# Patient Record
Sex: Female | Born: 1950 | Race: White | Hispanic: No | Marital: Married | Smoking: Never smoker
Health system: Southern US, Community
[De-identification: ages and names within clinical notes are randomized; demographics above are authoritative.]

## PROBLEM LIST (undated history)

## (undated) DIAGNOSIS — I10 Essential (primary) hypertension: Secondary | ICD-10-CM

## (undated) DIAGNOSIS — I639 Cerebral infarction, unspecified: Secondary | ICD-10-CM

## (undated) DIAGNOSIS — C801 Malignant (primary) neoplasm, unspecified: Secondary | ICD-10-CM

## (undated) DIAGNOSIS — E079 Disorder of thyroid, unspecified: Secondary | ICD-10-CM

## (undated) HISTORY — PX: THYROIDECTOMY: SHX17

## (undated) HISTORY — PX: BACK SURGERY: SHX140

## (undated) HISTORY — PX: MASTECTOMY: SHX3

## (undated) HISTORY — PX: ABDOMINAL HYSTERECTOMY: SHX81

## (undated) HISTORY — PX: JOINT REPLACEMENT: SHX530

---

## 2014-05-23 ENCOUNTER — Emergency Department (HOSPITAL_BASED_OUTPATIENT_CLINIC_OR_DEPARTMENT_OTHER)
Admission: EM | Admit: 2014-05-23 | Discharge: 2014-05-23 | Disposition: A | Discharge status: Home / Self Care | Payer: BC Managed Care – PPO | Attending: Emergency Medicine | Admitting: Emergency Medicine

## 2014-05-23 ENCOUNTER — Encounter (HOSPITAL_BASED_OUTPATIENT_CLINIC_OR_DEPARTMENT_OTHER): Payer: Self-pay | Admitting: Emergency Medicine

## 2014-05-23 ENCOUNTER — Emergency Department (HOSPITAL_BASED_OUTPATIENT_CLINIC_OR_DEPARTMENT_OTHER): Payer: BC Managed Care – PPO

## 2014-05-23 DIAGNOSIS — J45909 Unspecified asthma, uncomplicated: Secondary | ICD-10-CM

## 2014-05-23 DIAGNOSIS — Z853 Personal history of malignant neoplasm of breast: Secondary | ICD-10-CM | POA: Insufficient documentation

## 2014-05-23 DIAGNOSIS — M7989 Other specified soft tissue disorders: Secondary | ICD-10-CM | POA: Insufficient documentation

## 2014-05-23 DIAGNOSIS — J45901 Unspecified asthma with (acute) exacerbation: Secondary | ICD-10-CM | POA: Insufficient documentation

## 2014-05-23 DIAGNOSIS — Z8639 Personal history of other endocrine, nutritional and metabolic disease: Secondary | ICD-10-CM | POA: Insufficient documentation

## 2014-05-23 DIAGNOSIS — Z862 Personal history of diseases of the blood and blood-forming organs and certain disorders involving the immune mechanism: Secondary | ICD-10-CM | POA: Insufficient documentation

## 2014-05-23 DIAGNOSIS — IMO0002 Reserved for concepts with insufficient information to code with codable children: Secondary | ICD-10-CM | POA: Insufficient documentation

## 2014-05-23 DIAGNOSIS — I10 Essential (primary) hypertension: Secondary | ICD-10-CM | POA: Insufficient documentation

## 2014-05-23 DIAGNOSIS — Z79899 Other long term (current) drug therapy: Secondary | ICD-10-CM | POA: Insufficient documentation

## 2014-05-23 HISTORY — DX: Essential (primary) hypertension: I10

## 2014-05-23 HISTORY — DX: Malignant (primary) neoplasm, unspecified: C80.1

## 2014-05-23 HISTORY — DX: Disorder of thyroid, unspecified: E07.9

## 2014-05-23 LAB — BASIC METABOLIC PANEL
Anion gap: 12 (ref 5–15)
BUN: 12 mg/dL (ref 6–23)
CALCIUM: 9.8 mg/dL (ref 8.4–10.5)
CO2: 32 mEq/L (ref 19–32)
CREATININE: 0.7 mg/dL (ref 0.50–1.10)
Chloride: 101 mEq/L (ref 96–112)
GFR calc Af Amer: >90 mL/min (ref >90)
GFR calc non Af Amer: >90 mL/min (ref >90)
GLUCOSE: 130 mg/dL — AB (ref 70–99)
Potassium: 3.2 mEq/L — ABNORMAL LOW (ref 3.7–5.3)
SODIUM: 145 meq/L (ref 137–147)

## 2014-05-23 LAB — CBC WITH DIFFERENTIAL/PLATELET
Basophils Absolute: 0 10*3/uL (ref 0.0–0.1)
Basophils Relative: 0 % (ref 0–1)
EOS ABS: 0.1 10*3/uL (ref 0.0–0.7)
EOS PCT: 1 % (ref 0–5)
HCT: 34.4 % — ABNORMAL LOW (ref 36.0–46.0)
HEMOGLOBIN: 10.8 g/dL — AB (ref 12.0–15.0)
Lymphocytes Relative: 19 % (ref 12–46)
Lymphs Abs: 0.9 10*3/uL (ref 0.7–4.0)
MCH: 32.2 pg (ref 26.0–34.0)
MCHC: 31.4 g/dL (ref 30.0–36.0)
MCV: 102.7 fL — ABNORMAL HIGH (ref 78.0–100.0)
MONO ABS: 0.7 10*3/uL (ref 0.1–1.0)
Monocytes Relative: 15 % — ABNORMAL HIGH (ref 3–12)
NEUTROS PCT: 64 % (ref 43–77)
Neutro Abs: 2.9 10*3/uL (ref 1.7–7.7)
Platelets: 207 10*3/uL (ref 150–400)
RBC: 3.35 MIL/uL — ABNORMAL LOW (ref 3.87–5.11)
RDW: 20.9 % — ABNORMAL HIGH (ref 11.5–15.5)
WBC: 4.5 10*3/uL (ref 4.0–10.5)

## 2014-05-23 LAB — TROPONIN I: Troponin I: <0.3 ng/mL (ref <0.30)

## 2014-05-23 LAB — PRO B NATRIURETIC PEPTIDE: Pro B Natriuretic peptide (BNP): 1066 pg/mL — ABNORMAL HIGH (ref 0–125)

## 2014-05-23 MED ORDER — IPRATROPIUM BROMIDE 0.02 % IN SOLN
0.5000 mg | Freq: Once | RESPIRATORY_TRACT | Status: AC
  Administered 2014-05-23: 0.5 mg via RESPIRATORY_TRACT
  Filled 2014-05-23: qty 2.5

## 2014-05-23 MED ORDER — ALBUTEROL SULFATE (2.5 MG/3ML) 0.083% IN NEBU
5.0000 mg | INHALATION_SOLUTION | Freq: Once | RESPIRATORY_TRACT | Status: AC
  Administered 2014-05-23: 5 mg via RESPIRATORY_TRACT
  Filled 2014-05-23: qty 6

## 2014-05-23 MED ORDER — PREDNISONE 50 MG PO TABS
60.0000 mg | ORAL_TABLET | Freq: Once | ORAL | Status: AC
  Administered 2014-05-23: 60 mg via ORAL
  Filled 2014-05-23 (×2): qty 1

## 2014-05-23 MED ORDER — PREDNISONE 20 MG PO TABS: 40.0000 mg | ORAL_TABLET | Freq: Every day | ORAL | Status: DC

## 2014-05-23 MED ORDER — BECLOMETHASONE DIPROPIONATE 80 MCG/ACT IN AERS: 2.0000 | INHALATION_SPRAY | Freq: Two times a day (BID) | RESPIRATORY_TRACT | Status: DC

## 2014-05-23 NOTE — ED Notes (Signed)
Warm Blankets provided 

## 2014-05-23 NOTE — ED Provider Notes (Signed)
CSN: [id number]     Arrival date & time 05/23/14  1208 History   First MD Initiated Contact with Patient 05/23/14 1221     Chief Complaint  Patient presents with  . Shortness of Breath     (Consider location/radiation/quality/duration/timing/severity/associated sxs/prior Treatment) Patient is a 63 y.o. female presenting with shortness of breath. The history is provided by the patient.  Shortness of Breath Severity:  Moderate Associated symptoms: wheezing   Associated symptoms: no abdominal pain, no chest pain, no headaches, no rash and no vomiting    patient presents complaining of shortness of breath. Worse since yesterday. She's been on chemotherapy for breast cancer and since she started that she began having more shortness of breath. It has been associated somewhat with chemotherapy. She has been seen by numerous specialists including pulmonology at [facility] and was diagnosed with asthma. She is on controller and treatment medications. Patient's been worse last couple days. She is from [place] but has been in [place] for 3 weeks. No fever. No cough. No palpitations. She's had some swelling in her left arm her legs, which is unusual for her. 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 Hypertension   . Cancer     breast cancer  . Thyroid disease    Past Surgical History  Procedure Laterality Date  . Thyroidectomy    . Abdominal hysterectomy    . Back surgery    . Joint replacement    . Mastectomy Bilateral    No family history on file. History  Substance Use Topics  . Smoking status: Never Smoker   . Smokeless tobacco: Not on file  . Alcohol Use: No   OB History   Grav Para Term Preterm Abortions TAB SAB Ect Mult Living                 Review of Systems  Constitutional: Negative for activity change and appetite change.  Eyes: Negative for pain.   Respiratory: Positive for shortness of breath and wheezing. Negative for chest tightness.   Cardiovascular: Positive for leg swelling. Negative for chest pain.  Gastrointestinal: Negative for nausea, vomiting, abdominal pain and diarrhea.  Genitourinary: Negative for flank pain.  Musculoskeletal: Negative for back pain and neck stiffness.  Skin: Negative for rash.  Neurological: Negative for weakness, numbness and headaches.  Psychiatric/Behavioral: Negative for behavioral problems.      Allergies  Ace inhibitors  Home Medications   Prior to Admission medications   Medication Sig Start Date End Date Taking? Authorizing Provider  cloNIDine (CATAPRES) 0.2 MG tablet Take 0.2 mg by mouth 2 (two) times daily.   Yes Historical Provider, MD  eszopiclone (LUNESTA) 1 MG TABS tablet Take 3 mg by mouth at bedtime as needed for sleep. Take immediately before bedtime   Yes Historical Provider, MD  Fluticasone-Salmeterol (ADVAIR) 100-50 MCG/DOSE AEPB Inhale 1 puff into the lungs 2 (two) times daily.   Yes Historical Provider, MD  hydrochlorothiazide (HYDRODIURIL) 25 MG tablet Take 25 mg by mouth daily.   Yes Historical Provider, MD  metaxalone (SKELAXIN) 800 MG tablet Take 400 mg by mouth 3 (three) times daily.   Yes Historical Provider, MD  omeprazole (PRILOSEC) 40 MG capsule Take 40 mg by mouth daily.   Yes Historical Provider, MD  pregabalin (LYRICA) 100 MG capsule Take 400 mg by mouth daily.   Yes Historical Provider, MD  venlafaxine (EFFEXOR) 100 MG tablet Take 150 mg by mouth 1 day or 1 dose.   Yes Historical Provider, MD  beclomethasone (QVAR) 80 MCG/ACT inhaler Inhale 2 puffs into the lungs 2 (two) times daily. 05/23/14   Jasper Riling. Caryl Manas, MD  predniSONE (DELTASONE) 20 MG tablet Take 2 tablets (40 mg total) by mouth daily. 05/23/14   Jasper Riling. Sal Spratley, MD   BP 118/71  Pulse 80  Temp(Src) 98.2 F (36.8 C) (Oral)  Resp 22  Ht 5\' 8"  (1.727 m)  Wt 180 lb (81.647 kg)  BMI 27.38 kg/m2   SpO2 97% Physical Exam  Nursing note and vitals reviewed. Constitutional: She is oriented to person, place, and time. She appears well-developed and well-nourished.  HENT:  Head: Normocephalic and atraumatic.  Eyes: EOM are normal. Pupils are equal, round, and reactive to light.  Neck: Normal range of motion. Neck supple.  Cardiovascular: Normal rate, regular rhythm and normal heart sounds.   No murmur heard. Pulmonary/Chest: Effort normal. No respiratory distress. She has wheezes. She has rales.  Harsh breath sounds. Few scattered wheezes. Rales in bilateral bases.  Abdominal: Soft. Bowel sounds are normal. She exhibits no distension. There is no tenderness. There is no rebound and no guarding.  Musculoskeletal: Normal range of motion. She exhibits edema.  Mild bilateral lower extremity pitting edema. Chronic left upper extremity lymphedema  Neurological: She is alert and oriented to person, place, and time. No cranial nerve deficit.  Skin: Skin is warm and dry.  Psychiatric: She has a normal mood and affect. Her speech is normal.    ED Course  Procedures (including critical care time) Labs Review Labs Reviewed  CBC WITH DIFFERENTIAL - Abnormal; Notable for the following:    RBC 3.35 (*)    Hemoglobin 10.8 (*)    HCT 34.4 (*)    MCV 102.7 (*)    RDW 20.9 (*)    Monocytes Relative 15 (*)    All other components within normal limits  BASIC METABOLIC PANEL - Abnormal; Notable for the following:    Potassium 3.2 (*)    Glucose, Bld 130 (*)    All other components within normal limits  PRO B NATRIURETIC PEPTIDE - Abnormal; Notable for the following:    Pro B Natriuretic peptide (BNP) 1066.0 (*)    All other components within normal limits  TROPONIN I    Imaging Review Dg Chest 2 View  05/23/2014   CLINICAL DATA:  63 year old female with shortness of Breath. Recent chemotherapy for breast cancer. Initial encounter.  EXAM: CHEST  2 VIEW  COMPARISON:  None.  FINDINGS: Right chest  porta cath in place. Bilateral chest wall tissue expanders. Additional postoperative changes to the right axilla.  Mild elevation of the right hemidiaphragm. No pneumothorax, pulmonary edema, pleural effusion or confluent pulmonary opacity. Cardiac size at the upper limits of normal. Other mediastinal contours are within normal limits. Visualized tracheal air column is within normal limits. No acute osseous abnormality identified.  IMPRESSION: Postoperative changes.  No acute cardiopulmonary abnormality.   Electronically Signed   By: Lars Pinks M.D.   On: 05/23/2014 13:27     EKG Interpretation   Date/Time:  Wednesday May 23 2014 12:54:08 EDT Ventricular Rate:  81 PR Interval:  134 QRS Duration: 90 QT Interval:  410 QTC Calculation: 476 R Axis:   38 Text Interpretation:  Normal sinus rhythm Normal ECG Confirmed by  Alvino Chapel  MD, Damar Petit [phone]) on 05/23/2014 2:27:25 PM  MDM   Final diagnoses:  Asthma, unspecified asthma severity, uncomplicated    Patient with shortness of breath. She has a history of asthma. Few scattered rales. X-ray reassuring. Patient feels somewhat better after treatment. BNP is mildly elevated. Unknown significance. Patient will followup with her doctors back at home. She leaves in 2 more days. She's been given steroids and refills of her medication  Jasper Riling. Alvino Chapel, MD 05/23/14 1428

## 2014-05-23 NOTE — Discharge Instructions (Signed)

## 2014-05-23 NOTE — ED Notes (Signed)
Patient visiting  from [place], states that she feels like she "can't get enough air in". Began yesterday without any other symptoms.

## 2014-09-27 ENCOUNTER — Encounter (HOSPITAL_COMMUNITY): Payer: Self-pay | Admitting: Emergency Medicine

## 2014-09-27 DIAGNOSIS — S0261XA Fracture of condylar process of mandible, initial encounter for closed fracture: Secondary | ICD-10-CM | POA: Insufficient documentation

## 2014-09-27 DIAGNOSIS — W108XXA Fall (on) (from) other stairs and steps, initial encounter: Secondary | ICD-10-CM | POA: Insufficient documentation

## 2014-09-27 DIAGNOSIS — Z7952 Long term (current) use of systemic steroids: Secondary | ICD-10-CM | POA: Insufficient documentation

## 2014-09-27 DIAGNOSIS — R55 Syncope and collapse: Secondary | ICD-10-CM | POA: Diagnosis not present

## 2014-09-27 DIAGNOSIS — Z7951 Long term (current) use of inhaled steroids: Secondary | ICD-10-CM | POA: Diagnosis not present

## 2014-09-27 DIAGNOSIS — Z8639 Personal history of other endocrine, nutritional and metabolic disease: Secondary | ICD-10-CM | POA: Diagnosis not present

## 2014-09-27 DIAGNOSIS — Y998 Other external cause status: Secondary | ICD-10-CM | POA: Insufficient documentation

## 2014-09-27 DIAGNOSIS — S0181XA Laceration without foreign body of other part of head, initial encounter: Secondary | ICD-10-CM | POA: Insufficient documentation

## 2014-09-27 DIAGNOSIS — Z79899 Other long term (current) drug therapy: Secondary | ICD-10-CM | POA: Diagnosis not present

## 2014-09-27 DIAGNOSIS — Y9301 Activity, walking, marching and hiking: Secondary | ICD-10-CM | POA: Insufficient documentation

## 2014-09-27 DIAGNOSIS — Z853 Personal history of malignant neoplasm of breast: Secondary | ICD-10-CM | POA: Diagnosis not present

## 2014-09-27 DIAGNOSIS — S0993XA Unspecified injury of face, initial encounter: Secondary | ICD-10-CM | POA: Diagnosis present

## 2014-09-27 DIAGNOSIS — Y92009 Unspecified place in unspecified non-institutional (private) residence as the place of occurrence of the external cause: Secondary | ICD-10-CM | POA: Diagnosis not present

## 2014-09-27 DIAGNOSIS — I10 Essential (primary) hypertension: Secondary | ICD-10-CM | POA: Insufficient documentation

## 2014-09-27 LAB — CBC WITH DIFFERENTIAL/PLATELET
BASOS PCT: 0 % (ref 0–1)
Basophils Absolute: 0 10*3/uL (ref 0.0–0.1)
EOS ABS: 0.3 10*3/uL (ref 0.0–0.7)
Eosinophils Relative: 4 % (ref 0–5)
HCT: 40.4 % (ref 36.0–46.0)
HEMOGLOBIN: 13.1 g/dL (ref 12.0–15.0)
LYMPHS ABS: 1 10*3/uL (ref 0.7–4.0)
Lymphocytes Relative: 14 % (ref 12–46)
MCH: 28.4 pg (ref 26.0–34.0)
MCHC: 32.4 g/dL (ref 30.0–36.0)
MCV: 87.6 fL (ref 78.0–100.0)
MONO ABS: 0.4 10*3/uL (ref 0.1–1.0)
MONOS PCT: 5 % (ref 3–12)
NEUTROS PCT: 77 % (ref 43–77)
Neutro Abs: 5.7 10*3/uL (ref 1.7–7.7)
Platelets: 155 10*3/uL (ref 150–400)
RBC: 4.61 MIL/uL (ref 3.87–5.11)
RDW: 15.5 % (ref 11.5–15.5)
WBC: 7.3 10*3/uL (ref 4.0–10.5)

## 2014-09-27 NOTE — ED Notes (Signed)
Pt. had a syncopal episode and fell  at home this evening after taking her sleeping pill , presents  with headache ,  bleeding at right ear , laceration at lower lip with broken teeth . Alert and oriented , speech clear , no facial asymmetry , ambulatory with no arm drift.

## 2014-09-28 ENCOUNTER — Emergency Department (HOSPITAL_COMMUNITY)
Admission: EM | Admit: 2014-09-28 | Discharge: 2014-09-28 | Disposition: A | Discharge status: Home / Self Care | Payer: BC Managed Care – PPO | Attending: Emergency Medicine | Admitting: Emergency Medicine

## 2014-09-28 ENCOUNTER — Emergency Department (HOSPITAL_COMMUNITY): Payer: BC Managed Care – PPO

## 2014-09-28 DIAGNOSIS — W19XXXA Unspecified fall, initial encounter: Secondary | ICD-10-CM

## 2014-09-28 DIAGNOSIS — R55 Syncope and collapse: Secondary | ICD-10-CM

## 2014-09-28 DIAGNOSIS — S0181XA Laceration without foreign body of other part of head, initial encounter: Secondary | ICD-10-CM

## 2014-09-28 DIAGNOSIS — S02609A Fracture of mandible, unspecified, initial encounter for closed fracture: Secondary | ICD-10-CM

## 2014-09-28 DIAGNOSIS — R51 Headache: Secondary | ICD-10-CM

## 2014-09-28 DIAGNOSIS — R519 Headache, unspecified: Secondary | ICD-10-CM

## 2014-09-28 LAB — COMPREHENSIVE METABOLIC PANEL
ALBUMIN: 3.8 g/dL (ref 3.5–5.2)
ALK PHOS: 88 U/L (ref 39–117)
ALT: 12 U/L (ref 0–35)
ANION GAP: 11 (ref 5–15)
AST: 18 U/L (ref 0–37)
BUN: 32 mg/dL — AB (ref 6–23)
CO2: 29 mEq/L (ref 19–32)
Calcium: 9.2 mg/dL (ref 8.4–10.5)
Chloride: 102 mEq/L (ref 96–112)
Creatinine, Ser: 1.6 mg/dL — ABNORMAL HIGH (ref 0.50–1.10)
GFR calc non Af Amer: 34 mL/min — ABNORMAL LOW (ref >90)
GFR, EST AFRICAN AMERICAN: 39 mL/min — AB (ref >90)
GLUCOSE: 112 mg/dL — AB (ref 70–99)
POTASSIUM: 3.7 meq/L (ref 3.7–5.3)
Sodium: 142 mEq/L (ref 137–147)
TOTAL PROTEIN: 7 g/dL (ref 6.0–8.3)
Total Bilirubin: 0.4 mg/dL (ref 0.3–1.2)

## 2014-09-28 MED ORDER — MORPHINE SULFATE 4 MG/ML IJ SOLN
4.0000 mg | Freq: Once | INTRAMUSCULAR | Status: AC
  Administered 2014-09-28: 4 mg via INTRAVENOUS
  Filled 2014-09-28: qty 1

## 2014-09-28 MED ORDER — SODIUM CHLORIDE 0.9 % IV BOLUS (SEPSIS)
1000.0000 mL | Freq: Once | INTRAVENOUS | Status: AC
Start: 2014-09-28 — End: 2014-09-28
  Administered 2014-09-28: 1000 mL via INTRAVENOUS

## 2014-09-28 MED ORDER — OXYCODONE-ACETAMINOPHEN 5-325 MG PO TABS: 1.0000 | ORAL_TABLET | ORAL | Status: DC | PRN

## 2014-09-28 MED ORDER — MORPHINE SULFATE 4 MG/ML IJ SOLN
6.0000 mg | Freq: Once | INTRAMUSCULAR | Status: AC
  Administered 2014-09-28: 6 mg via INTRAVENOUS
  Filled 2014-09-28: qty 2

## 2014-09-28 MED ORDER — CIPROFLOXACIN-DEXAMETHASONE 0.3-0.1 % OT SUSP: 4.0000 [drp] | Freq: Two times a day (BID) | OTIC | Status: AC

## 2014-09-28 NOTE — ED Notes (Signed)
Dr. Campos at bedside   

## 2014-09-28 NOTE — Discharge Instructions (Signed)
Tissue Adhesive Wound Care °Some cuts, wounds, lacerations, and incisions can be repaired by using tissue adhesive. Tissue adhesive is like glue. It holds the skin together, allowing for faster healing. It forms a strong bond on the skin in about 1 minute and reaches its full strength in about 2 or 3 minutes. The adhesive disappears naturally while the wound is healing. It is important to take proper care of your wound at home while it heals.  °HOME CARE INSTRUCTIONS  °· Showers are allowed. Do not soak the area containing the tissue adhesive. Do not take baths, swim, or use hot tubs. Do not use any soaps or ointments on the wound. Certain ointments can weaken the glue. °· If a bandage (dressing) has been applied, follow your health care provider's instructions for how often to change the dressing.   °· Keep the dressing dry if one has been applied.   °· Do not scratch, pick, or rub the adhesive.   °· Do not place tape over the adhesive. The adhesive could come off when pulling the tape off.   °· Protect the wound from further injury until it is healed.   °· Protect the wound from sun and tanning bed exposure while it is healing and for several weeks after healing.   °· Only take over-the-counter or prescription medicines as directed by your health care provider.   °· Keep all follow-up appointments as directed by your health care provider. °SEEK IMMEDIATE MEDICAL CARE IF:  °· Your wound becomes red, swollen, hot, or tender.   °· You develop a rash after the glue is applied. °· You have increasing pain in the wound.   °· You have a red streak that goes away from the wound.   °· You have pus coming from the wound.   °· You have increased bleeding. °· You have a fever. °· You have shaking chills.   °· You notice a bad smell coming from the wound.   °· Your wound or adhesive breaks open.   °MAKE SURE YOU:  °· Understand these instructions. °· Will watch your condition. °· Will get help right away if you are not doing  well or get worse. °Document Released: 04/21/2001 Document Revised: 08/16/2013 Document Reviewed: 05/17/2013 °ExitCare® Patient Information ©2015 [employer]. This information is not intended to replace advice given to you by your health care provider. Make sure you discuss any questions you have with your health care provider. ° °

## 2014-09-28 NOTE — ED Provider Notes (Signed)
CSN: [id number]     Arrival date & time 09/27/14  2308 History   First MD Initiated Contact with Patient 09/28/14 0030     Chief Complaint  Patient presents with  . Loss of Consciousness      HPI Patient reports she was walking up the stairs this evening sometime after taking both a sleeping pill as well as her evening pain pill in the next thing she knew she was on the ground.  She reports pain in her jaw and temporal regions bilaterally.  She reports trismus.  She reports several remnants of fragmented teeth were found around her.  She has bleeding coming from her right external auditory canal.  She denies preceding chest pain or palpitations.  No prior history of syncope.  She has a history of hypertension, breast cancer, thyroid disease.  She is not on anticoagulants.  Denies headache at this time.  No neck pain or neck stiffness.  Weakness or numbness of her upper or lower extremities.  No chest pain or abdominal pain.  No recent illness.   Past Medical History  Diagnosis Date  . Hypertension   . Cancer     breast cancer  . Thyroid disease    Past Surgical History  Procedure Laterality Date  . Thyroidectomy    . Abdominal hysterectomy    . Back surgery    . Joint replacement    . Mastectomy Bilateral    No family history on file. History  Substance Use Topics  . Smoking status: Never Smoker   . Smokeless tobacco: Not on file  . Alcohol Use: No   OB History    No data available     Review of Systems  All other systems reviewed and are negative.     Allergies  Ace inhibitors  Home Medications   Prior to Admission medications   Medication Sig Start Date End Date Taking? Authorizing Provider  anastrozole (ARIMIDEX) 1 MG tablet Take 1 mg by mouth daily.  09/03/14  Yes Historical Provider, MD  beclomethasone (QVAR) 80 MCG/ACT inhaler Inhale 2 puffs into the lungs 2 (two) times daily. Patient taking differently: Inhale 2 puffs into the lungs 2 (two) times daily as  needed (for breathing).  05/23/14  Yes Jasper Riling. Pickering, MD  cloNIDine (CATAPRES) 0.2 MG tablet Take 0.2 mg by mouth 2 (two) times daily.   Yes Historical Provider, MD  Eszopiclone (ESZOPICLONE) 3 MG TABS Take 3 mg by mouth at bedtime. Take immediately before bedtime   Yes Historical Provider, MD  Fluticasone-Salmeterol (ADVAIR) 250-50 MCG/DOSE AEPB Inhale 1 puff into the lungs 2 (two) times daily as needed (for breathing).   Yes Historical Provider, MD  metaxalone (SKELAXIN) 400 MG tablet Take 800 mg by mouth at bedtime.   Yes Historical Provider, MD  methylphenidate (RITALIN) 10 MG tablet Take 10 mg by mouth daily.   Yes Historical Provider, MD  omeprazole (PRILOSEC) 40 MG capsule Take 40 mg by mouth 2 (two) times daily.    Yes Historical Provider, MD  oxycodone (ROXICODONE) 30 MG immediate release tablet Take 30 mg by mouth every 6 (six) hours as needed for pain.    Yes Historical Provider, MD  pregabalin (LYRICA) 200 MG capsule Take 600 mg by mouth at bedtime.   Yes Historical Provider, MD  venlafaxine XR (EFFEXOR XR) 150 MG 24 hr capsule Take 300 mg by mouth daily with breakfast.    Yes Historical Provider, MD  eszopiclone (LUNESTA) 1 MG TABS tablet Take  3 mg by mouth at bedtime as needed for sleep. Take immediately before bedtime    Historical Provider, MD  Fluticasone-Salmeterol (ADVAIR) 100-50 MCG/DOSE AEPB Inhale 1 puff into the lungs 2 (two) times daily.    Historical Provider, MD  hydrochlorothiazide (HYDRODIURIL) 25 MG tablet Take 25 mg by mouth daily.    Historical Provider, MD  metaxalone (SKELAXIN) 800 MG tablet Take 400 mg by mouth 3 (three) times daily.    Historical Provider, MD  predniSONE (DELTASONE) 20 MG tablet Take 2 tablets (40 mg total) by mouth daily. 05/23/14   Jasper Riling. Pickering, MD  pregabalin (LYRICA) 100 MG capsule Take 400 mg by mouth daily.    Historical Provider, MD  venlafaxine (EFFEXOR) 100 MG tablet Take 150 mg by mouth 1 day or 1 dose.    Historical Provider,  MD   BP 117/86 mmHg  Pulse 83  Temp(Src) 97.8 F (36.6 C) (Oral)  Resp 16  Ht 5\' 8"  (1.727 m)  Wt 164 lb (74.39 kg)  BMI 24.94 kg/m2  SpO2 93% Physical Exam  Constitutional: She is oriented to person, place, and time. She appears well-developed and well-nourished. No distress.  HENT:  Head: Normocephalic and atraumatic.  Trismus and malocclusion.  Small laceration to her superior chin without bleeding.  Several small fragmented Ellis 1 and 2 fractures of multiple teeth.  Poor dentition at baseline.  No obvious avulsed teeth.  Bright red blood from right external auditory canal.  Blood in the canal limits visualization of the TM on the right.  Normal TM on the left.  Tenderness of her bilateral mandibular condyles.  Eyes: EOM are normal.  Neck: Normal range of motion.  Cardiovascular: Normal rate, regular rhythm and normal heart sounds.   Pulmonary/Chest: Effort normal and breath sounds normal.  Abdominal: Soft. She exhibits no distension. There is no tenderness.  Musculoskeletal: Normal range of motion.  Neurological: She is alert and oriented to person, place, and time.  Skin: Skin is warm and dry.  Psychiatric: She has a normal mood and affect. Judgment normal.  Nursing note and vitals reviewed.   ED Course  Procedures (including critical care time)  LACERATION REPAIR Performed by: Hoy Morn Consent: Verbal consent obtained. Risks and benefits: risks, benefits and alternatives were discussed Patient identity confirmed: provided demographic data Time out performed prior to procedure Prepped and Draped in normal sterile fashion Wound explored Laceration Location: chin Laceration Length: 1cm No Foreign Bodies seen or palpated Anesthesia: none Irrigation method: syringe Amount of cleaning: standard Skin closure: dermabond Number of sutures or staples: dermabond Technique: dermabond Patient tolerance: Patient tolerated the procedure well with no immediate  complications.   Labs Review Labs Reviewed  COMPREHENSIVE METABOLIC PANEL - Abnormal; Notable for the following:    Glucose, Bld 112 (*)    BUN 32 (*)    Creatinine, Ser 1.60 (*)    GFR calc non Af Amer 34 (*)    GFR calc Af Amer 39 (*)    All other components within normal limits  CBC WITH DIFFERENTIAL    Imaging Review Ct Maxillofacial Wo Cm  09/28/2014   CLINICAL DATA:  [place] on face this evening, knocked out teeth, bilateral jaw pain. RIGHT hemotympanum. Syncopal episode at home.  EXAM: CT MAXILLOFACIAL WITHOUT CONTRAST  TECHNIQUE: Multidetector CT imaging of the maxillofacial structures was performed. Multiplanar CT image reconstructions were also generated. A small metallic BB was placed on the right temple in order to reliably differentiate right from left.  COMPARISON:  None.  FINDINGS: Comminuted fracture of the RIGHT mandible condyle with anterior displacement of the articular condyle which is immediately anterior to the articular eminence. Soft tissue most consistent with blood products and air within the glenoid fossa. Nondisplaced fracture the anterior wall of the RIGHT external auditory canal. The RIGHT middle ear and mastoid air cells are well aerated. Oblique displaced RIGHT mandible condyle fracture, with anteriorly subluxed condyles. LEFT middle ear and mastoid air cells appear well-aerated.  Poor dentition with multiple dental caries. Absent LEFT mandible incisor (approximate tooth 23)may reflect acute avulsion. Scattered periapical lucencies. No destructive bony lesions.  Trace paranasal sinus mucosal thickening without air-fluid levels. Nasal septum is slightly deviated to the RIGHT. Ocular globes and orbital contents are unremarkable.  Small not air within the RIGHT parotid gland, with relatively fatty replaced bilateral parotid glands. Os odontoideum. Severe C4-5 and C5-6 degenerative discs partially imaged.  IMPRESSION: Displaced bilateral mandible condyles fractures,  nondisplaced fracture the anterior wall of the RIGHT external auditory canal.  Absent teeth mandible incisor (approximate tooth 23) may reflect acute avulsion. Poor dentition.   Electronically Signed   By: Elon Alas   On: 09/28/2014 02:52  I personally reviewed the imaging tests through PACS system I reviewed available ER/hospitalization records through the EMR    EKG Interpretation   Date/Time:  Thursday September 27 2014 23:30:30 EST Ventricular Rate:  94 PR Interval:  136 QRS Duration: 84 QT Interval:  380 QTC Calculation: 475 R Axis:   -3 Text Interpretation:  Normal sinus rhythm Possible Anterior infarct , age  undetermined Abnormal ECG No significant change was found Confirmed by  Brodyn Depuy  MD, Lennette Bihari ([id number]) on 09/28/2014 4:56:06 AM      MDM   Final diagnoses:  Fall  Facial pain  Syncope, unspecified syncope type  Mandible fracture, closed, initial encounter  Facial laceration, initial encounter    I spoke with our ear nose and throat surgeon Dr. Constance Holster who personally reviewed her images.  He recommended soft diet and follow-up in his office next week.  Patient has pain medication at home.  She'll be given when necessary oxycodone to take in addition to her chronic pain meds for breakthrough pain only.  ENT is recommending Ciprodex for her right anterior auditory canal fracture.  The bleeding seems to be somewhat reduced at this time.  She will eventually need dental follow-up as well.  Regarding her syncope itself this will need to be followed up by primary care physician.  I suspect this is more induced by her pain medicine and sleeping pill which was taken some time before going to bed.    Hoy Morn, MD 09/28/14 [phone]

## 2014-09-28 NOTE — ED Notes (Signed)
Cotton ball placed in R ear for bleeding

## 2014-09-28 NOTE — ED Notes (Signed)
Pt returned from CT. Assisted pt to bathroom. Pt ambulating with steady gait. C/o "migraine" headache at this time.

## 2014-09-28 NOTE — ED Notes (Addendum)
Pt c/o bilateral jaw pain and headache since falling at home. Pt reports taking a sleeping pill followed up a pain medication. She got up from bed, thinking her grandchildren were crying and "blacked out," falling face first into the floor. Pt has bright red drainage noted from R ear, unable to visualize site of injury. Pt c/o frontal headache associated with photophobia after crying and jaw pain, worse on R side. Pain in jaw becomes worse when attempting to open mouth. Pt reports the headache did not begin until after she started crying. Multiple teeth noted to be broken. Pt has ~2cm lac noted to inner bottom lip. Pt ambulatory on arrival.

## 2014-09-28 NOTE — ED Notes (Signed)
Pt given ginger ale to drink per Dr.Campos; dermabond at bedside

## 2014-09-28 NOTE — ED Notes (Signed)
Husband placed second cotton ball in R ear; ear continues to ooze

## 2014-09-28 NOTE — ED Notes (Signed)
The patient's right ear is still bleeding. The tech has reported to RN in charge.

## 2015-01-11 DIAGNOSIS — C50412 Malignant neoplasm of upper-outer quadrant of left female breast: Secondary | ICD-10-CM | POA: Insufficient documentation

## 2015-12-18 DIAGNOSIS — G47419 Narcolepsy without cataplexy: Secondary | ICD-10-CM | POA: Insufficient documentation

## 2015-12-18 DIAGNOSIS — E785 Hyperlipidemia, unspecified: Secondary | ICD-10-CM | POA: Insufficient documentation

## 2015-12-18 DIAGNOSIS — G43009 Migraine without aura, not intractable, without status migrainosus: Secondary | ICD-10-CM | POA: Insufficient documentation

## 2016-07-31 DIAGNOSIS — F418 Other specified anxiety disorders: Secondary | ICD-10-CM | POA: Insufficient documentation

## 2017-07-28 ENCOUNTER — Encounter: Payer: Self-pay | Admitting: Neurology

## 2017-07-28 ENCOUNTER — Institutional Professional Consult (permissible substitution): Payer: Medicare Other | Admitting: Neurology

## 2017-07-29 ENCOUNTER — Encounter: Payer: Self-pay | Admitting: Neurology

## 2017-07-29 ENCOUNTER — Ambulatory Visit (INDEPENDENT_AMBULATORY_CARE_PROVIDER_SITE_OTHER): Payer: Medicare Other | Admitting: Neurology

## 2017-07-29 VITALS — BP 160/90 | HR 78 | Ht 68.0 in | Wt 170.0 lb

## 2017-07-29 DIAGNOSIS — Z820 Family history of epilepsy and other diseases of the nervous system: Secondary | ICD-10-CM

## 2017-07-29 DIAGNOSIS — G4710 Hypersomnia, unspecified: Secondary | ICD-10-CM

## 2017-07-29 DIAGNOSIS — K08109 Complete loss of teeth, unspecified cause, unspecified class: Secondary | ICD-10-CM

## 2017-07-29 DIAGNOSIS — G47419 Narcolepsy without cataplexy: Secondary | ICD-10-CM

## 2017-07-29 DIAGNOSIS — K000 Anodontia: Secondary | ICD-10-CM

## 2017-07-29 DIAGNOSIS — G2581 Restless legs syndrome: Secondary | ICD-10-CM | POA: Diagnosis not present

## 2017-07-29 DIAGNOSIS — G4730 Sleep apnea, unspecified: Secondary | ICD-10-CM

## 2017-07-29 MED ORDER — AMPHETAMINE-DEXTROAMPHETAMINE 10 MG PO TABS: 10.0000 mg | ORAL_TABLET | Freq: Every day | ORAL | 0 refills | Status: DC

## 2017-07-29 NOTE — Progress Notes (Signed)
SLEEP MEDICINE CLINIC   Provider:  Larey Seat, M D  Primary Care Physician:  Daylene Posey, [place]   Referring Provider: Daylene Posey, FNP     HPI:  Cheryl Levy is a 66 y.o. 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 She has been treated with Ritalin. At this time she also has trouble with insomnia which is uncharacteristic.  In her review of systems she endorsed depression, the feeling of not getting enough sleep of having daytime decreased energy, death interest in activities, insomnia, restless legs, weakness, headaches, joint pain, aching muscles, fatigue and tinnitus.  The patient is currently treated with anastrozole, albuterol, clonidine, metaxalone, methylphenidate, pregabalin, lunesta.   Chief complaint according to patient :  over the last couple of month she fell asleep during activities and her doctor told her to take 2 or 3 Ritalins.   Sleep habits are as follows: Cheryl Levy retreats to her room by 8 PM and watches their TV until 10. She has set this time aside for her. 10:00 is not necessarily her sleep time however it may take her until 11 or even 1 AM to go to sleep, she has chronic insomnia more than 3 times a week. Once asleep she can stay asleep for 4-5 hours. Her bedroom is cool quiet and dark once the TV is off. She prefers to sleep on her side. One pillow. The patient wakes up to 3 times to go to the bathroom at night. It also her aches and pains that make it harder for her to find a comfortable position. Overall she gets 4 -5 hours of sleep only. He rarely naps.    Sleep medical history and family sleep history:  She carries a diagnosis of migraine, depression, breast cancer, hypertension and fibromyalgia. She had bilateral mastectomies and reconstructive surgeries she also is status post hysterectomy and back surgery. Sister with  sleep apnea. Sister carries diagnosis of narcolepsy, mother likely had it , too. Daughter has narcolepsy with cataplexy.   Social history: from [place] , moved 3 years ago. Never a smoker, never alcohol drinker, caffeine- none.    Review of Systems: Out of a complete 14 system review, the patient complains of only the following symptoms, and all other reviewed systems are negative.  Snoring, insomnia, depression, anxiety.   Epworth score 14 , Fatigue severity score 60  , depression score 6/16    Social History   Social History  . Marital status: Married    Spouse name: N/A  . Number of children: N/A  . Years of education: N/A   Occupational History  . Not on file.   Social History Main Topics  . Smoking status: Never Smoker  . Smokeless tobacco: Never Used  . Alcohol use No  . Drug use: No  . Sexual activity: Not on file   Other Topics Concern  . Not on file   Social History Narrative  . No narrative on file    No family history on file.  Past Medical History:  Diagnosis Date  . Cancer [facility] [place])    breast cancer  . Hypertension   . Thyroid disease     Past Surgical History:  Procedure Laterality Date  . ABDOMINAL HYSTERECTOMY    . BACK SURGERY    . JOINT REPLACEMENT    . MASTECTOMY Bilateral   . THYROIDECTOMY  Current Outpatient Prescriptions  Medication Sig Dispense Refill  . anastrozole (ARIMIDEX) 1 MG tablet Take 1 mg by mouth daily.     . beclomethasone (QVAR) 80 MCG/ACT inhaler Inhale 2 puffs into the lungs 2 (two) times daily. (Patient taking differently: Inhale 2 puffs into the lungs 2 (two) times daily as needed (for breathing). ) 1 Inhaler 0  . cloNIDine (CATAPRES) 0.2 MG tablet Take 0.2 mg by mouth 2 (two) times daily.    . DULoxetine (CYMBALTA) 60 MG capsule Take 60 mg by mouth.    . eszopiclone (LUNESTA) 1 MG TABS tablet Take 3 mg by mouth at bedtime as needed for sleep. Take immediately before bedtime    . Fluticasone-Salmeterol (ADVAIR)  500-50 MCG/DOSE AEPB Inhale 1 puff into the lungs 2 (two) times daily as needed (for breathing).    . metaxalone (SKELAXIN) 800 MG tablet Take 400 mg by mouth 3 (three) times daily.    . methylphenidate (RITALIN) 10 MG tablet Take 10 mg by mouth daily.    Marland Kitchen omeprazole (PRILOSEC) 40 MG capsule Take 40 mg by mouth 2 (two) times daily.     . pregabalin (LYRICA) 200 MG capsule Take 600 mg by mouth at bedtime.    . protriptyline (VIVACTIL) 10 MG tablet Take 10 mg by mouth.     No current facility-administered medications for this visit.     Allergies as of 07/29/2017 - Review Complete 09/28/2014  Allergen Reaction Noted  . Ace inhibitors Other (See Comments) 05/23/2014    Vitals: BP (!) 160/90   Pulse 78   Ht '5\' 8"'  (1.727 m)   Wt 170 lb (77.1 kg)   BMI 25.85 kg/m  Last Weight:  Wt Readings from Last 1 Encounters:  07/29/17 170 lb (77.1 kg)   GDJ:MEQA mass index is 25.85 kg/m.     Last Height:   Ht Readings from Last 1 Encounters:  07/29/17 '5\' 8"'  (1.727 m)    Physical exam:  General: The patient is awake, alert and appears not in acute distress. The patient is well groomed. Head: Normocephalic, atraumatic. Neck is supple. Mallampati 4 neck circumference: 16. Nasal airflow restricted ,Retrognathia is seen. Small lower jaw.  Cardiovascular:  Regular rate and rhythm , without  murmurs or carotid bruit, and without distended neck veins. Respiratory: Lungs are clear to auscultation. Skin:  Without evidence of edema, or rash Trunk: BMI is 26. The patient's posture is erect   Neurologic exam : The patient is awake and alert, oriented to place and time.  Speech is fluent,  without dysarthria, dysphonia or aphasia. Mood and affect are appropriate.  Cranial nerves: Pupils are equal and briskly reactive to light. Funduscopic exam without evidence of pallor or edema. Extraocular movements  in vertical and horizontal planes intact and without nystagmus. Visual fields by finger perimetry are  intact. Hearing to finger rub intact. Facial sensation intact to fine touch. Facial motor strength is symmetric and tongue and uvula move midline. Shoulder shrug was symmetrical.  Motor exam: Normal tone, muscle bulk and symmetric strength in all extremities. Sensory:  Fine touch, pinprick and vibration were tested in all extremities. Proprioception tested in the upper extremities was normal. Coordination: Rapid alternating movements in the fingers/hands was normal. Finger-to-nose maneuver  normal without evidence of ataxia, dysmetria or tremor. Gait and station: Patient walks without assistive device. Turns with 3  Steps. Romberg testing is  negative. Deep tendon reflexes: in the  upper and lower extremities are symmetric and intact. Babinski maneuver  response is downgoing.  Assessment:  After physical and neurologic examination, review of laboratory studies,  Personal review of imaging studies, reports of other /same  Imaging studies, results of polysomnography and / or neurophysiology testing and pre-existing records as far as provided in visit., my assessment is   1)  Mrs. Tuft reports neither dream intrusion, nor sleep paralysis nor cataplexy. 20 years ago she was highly excessively daytime sleepy and her diagnosis followed that of her daughter with narcolepsy and cataplexy. She also has a sister and has a high suspicion that her mother was affected by the same disorder. Only months ago did she find her excessive sleepiness became worse again. I do not have access to her original sleep studies from the [facility], and it may be necessary for her to be tested again by PSG and MS LT but given her current medication list I do not think it's possible. A meaningful MS LT test requires that the patient is not on any REM sleep suppressant medication and there are several on her list. I would be willing to send an HLA narcolepsy test, and I would be very willing to change the current Ritalin  prescription to Adderall and try that medication.  Again, I'm not sure that I can reproduce the results of her sleep tests from years ago but her insurance may require this if we want to advance to more modern medications the treatment of narcolepsy. These would include modafinil, armodafinil and finally Xyrem.   dear NP Sherren Mocha, I would like to ask you as her PCP to find a pain clinic to address the fibromyalgia , aches and pains that I do not consider neurological and are not in my sleep clinic roam of practice .   The patient was advised of the nature of the diagnosed disorder , the treatment options and the  risks for general health and wellness arising from not treating the condition.   I spent more than 45 minutes of face to face time with the patient.  Greater than 50% of time was spent in counseling and coordination of care. We have discussed the diagnosis and differential and I answered the patient's questions.    Plan:  Treatment plan and additional workup :  Adderall 10 mg . D/C Ritalin.   Check PSG for presence of apnea, snoring.   Not ordering MSLT; she has Cymbalta and Nortripyline on board which will preclude a valid  MSLT.     Larey Seat, MD 0/78/6754, 4:92 PM  Certified in Neurology by ABPN Certified in [place] by [facility] [address] [place], [place] [address]

## 2017-07-29 NOTE — Addendum Note (Signed)
Addended by: Larey Seat on: 07/29/2017 01:56 PM   Modules accepted: Orders

## 2017-07-29 NOTE — Patient Instructions (Signed)
You may have a condition called narcolepsy: This means, that you have a sleep disorder that manifests with at times severe excessive sleepiness during the day and often with problems with sleep at night. We may have to try different medications that may help you stay awake during the day. Not everything works with everybody the same way. Wake promoting agents include stimulants and non-stimulant type medications. The most common side effects with stimulants are weight loss, insomnia, nervousness, headaches, palpitations, rise in blood pressure, anxiety. Stimulants can be addictive and subject to abuse. Non-stimulant type wake promoting medications include Provigil and Nuvigil, most common side effects include headaches, nervousness, insomnia, hypertension. In addition there is a medication called Xyrem which has been proven to be very effective in patients with narcolepsy with or without cataplexy. Some patients with narcolepsy report episodes of weakness, such as jaw or facial weakness, legs giving out, feeling wobbly or like "Jell-o", etc. in situations of anxiety, stress, laughter, sudden sadness, surprise, etc., which is called cataplexy. You can also experience episodes of sleep paralysis during which you may feel unable to move upon awakening. Some people experience dreamlike sequences upon awakening or upon drifting off to sleep, called hypnopompic or hypnagogic hallucinations.

## 2017-08-02 LAB — NARCOLEPSY EVALUATION
HLA-DQ ALPHA: NEGATIVE
HLA-DQ BETA: NEGATIVE

## 2017-08-03 ENCOUNTER — Telehealth: Payer: Self-pay | Admitting: Neurology

## 2017-08-03 NOTE — Telephone Encounter (Signed)
Informed patient of the negative HLA test. Pt will be coming in for sleep study once the insurance approves it. Pt verbalized understanding.

## 2017-08-03 NOTE — Telephone Encounter (Signed)
-----  Message from Larey Seat, MD sent at 08/02/2017  5:20 PM EDT ----- Negative HLA narcolepsy blood test. Please call.   CC to NP Todd. CD

## 2017-08-16 ENCOUNTER — Telehealth: Payer: Self-pay | Admitting: Neurology

## 2017-08-16 NOTE — Telephone Encounter (Signed)
Please have her try adderall 1.5 tablets - if this is improving her alertness, I will write for the higher dose.

## 2017-08-16 NOTE — Telephone Encounter (Signed)
Pt called she said amphetamine-dextroamphetamine (ADDERALL) 10 MG tablet is not working as well, by noon she is tired, can't hold her eyes open. She is wanting to know if it should be increased to be taken more times in the day

## 2017-08-17 NOTE — Telephone Encounter (Signed)
Called the patient to make her aware that Dr Brett Fairy has suggested she try taking 1.5 tablets daily to see if that helps. No answer. LVM with this information and explained to the patient on voicemail to call us back and let us know how she tolerates this and if well we can adjust her prescription to say this.

## 2017-08-18 DIAGNOSIS — M8589 Other specified disorders of bone density and structure, multiple sites: Secondary | ICD-10-CM | POA: Insufficient documentation

## 2017-09-27 ENCOUNTER — Telehealth: Payer: Self-pay

## 2017-09-27 NOTE — Telephone Encounter (Signed)
Pt was scheduled for a sleep study on 09/21/17 at 8pm. Pt cancelled that appt. Called pt back on 09/27/17 @ 10:00am to reschedule and pt refused. Pt stated she's had plenty of them done in the past and didn't see the need again.

## 2017-09-28 ENCOUNTER — Telehealth: Payer: Self-pay | Admitting: Neurology

## 2017-09-28 NOTE — Telephone Encounter (Signed)
Dear Daylene Posey, FNP   The patient's documentation for the presence of narcolepsy is not longer accepted by her insurance carrier or is not available in detail from previous testing ( 20 years ago)- she would need to undergo a PSG followed by M SLT. MSLT was deemed impossible under her current medications, she would have to wean off all REM supressants.  She was worked in before Thanksgiving to have a PSG to allow testing for other sleep disorders that can cause hypersomnia,  but patient refused to come in, she cancelled and she will not reschedule as she informed us. An attended sleep study would also have helped to see if she needs apnea treatment and at which pressure of CPAP she may need apnea treatment .  Her HLA narcolepsy panel was negative. She takes stimulant medications, for about 20 years now.   I have no supporting evidence for the presence of narcolepsy in this patient ( except a positive family history) that would allow me to treat the patient with narcolepsy specific medications ( Modafinil, Armodafinil, Xyrem) . A MSLT is not valid on current medications.   The patient is free to take her sleep care elsewhere. I will send a copy of this phone note to her FNP with a request to refer her to [facility].   Larey Seat, MD   Cc Daylene Posey, NP

## 2019-07-26 ENCOUNTER — Emergency Department (HOSPITAL_BASED_OUTPATIENT_CLINIC_OR_DEPARTMENT_OTHER): Payer: Medicare HMO

## 2019-07-26 ENCOUNTER — Emergency Department (HOSPITAL_BASED_OUTPATIENT_CLINIC_OR_DEPARTMENT_OTHER)
Admission: EM | Admit: 2019-07-26 | Discharge: 2019-07-26 | Disposition: A | Discharge status: Home / Self Care | Payer: Medicare HMO | Attending: Emergency Medicine | Admitting: Emergency Medicine

## 2019-07-26 ENCOUNTER — Encounter (HOSPITAL_BASED_OUTPATIENT_CLINIC_OR_DEPARTMENT_OTHER): Payer: Self-pay

## 2019-07-26 ENCOUNTER — Other Ambulatory Visit: Payer: Self-pay

## 2019-07-26 DIAGNOSIS — S52571A Other intraarticular fracture of lower end of right radius, initial encounter for closed fracture: Secondary | ICD-10-CM | POA: Diagnosis not present

## 2019-07-26 DIAGNOSIS — I10 Essential (primary) hypertension: Secondary | ICD-10-CM | POA: Insufficient documentation

## 2019-07-26 DIAGNOSIS — Z79899 Other long term (current) drug therapy: Secondary | ICD-10-CM | POA: Diagnosis not present

## 2019-07-26 DIAGNOSIS — E039 Hypothyroidism, unspecified: Secondary | ICD-10-CM | POA: Insufficient documentation

## 2019-07-26 DIAGNOSIS — Y929 Unspecified place or not applicable: Secondary | ICD-10-CM | POA: Diagnosis not present

## 2019-07-26 DIAGNOSIS — Z853 Personal history of malignant neoplasm of breast: Secondary | ICD-10-CM | POA: Insufficient documentation

## 2019-07-26 DIAGNOSIS — Y939 Activity, unspecified: Secondary | ICD-10-CM | POA: Insufficient documentation

## 2019-07-26 DIAGNOSIS — S52611A Displaced fracture of right ulna styloid process, initial encounter for closed fracture: Secondary | ICD-10-CM | POA: Diagnosis not present

## 2019-07-26 DIAGNOSIS — Y999 Unspecified external cause status: Secondary | ICD-10-CM | POA: Insufficient documentation

## 2019-07-26 DIAGNOSIS — S6991XA Unspecified injury of right wrist, hand and finger(s), initial encounter: Secondary | ICD-10-CM | POA: Diagnosis present

## 2019-07-26 DIAGNOSIS — W010XXA Fall on same level from slipping, tripping and stumbling without subsequent striking against object, initial encounter: Secondary | ICD-10-CM | POA: Diagnosis not present

## 2019-07-26 MED ORDER — OXYCODONE-ACETAMINOPHEN 5-325 MG PO TABS: 2.0000 | ORAL_TABLET | ORAL | 0 refills | Status: AC | PRN

## 2019-07-26 MED ORDER — OXYCODONE-ACETAMINOPHEN 5-325 MG PO TABS
1.0000 | ORAL_TABLET | Freq: Once | ORAL | Status: AC
Start: 2019-07-26 — End: 2019-07-26
  Administered 2019-07-26: 16:00:00 1 via ORAL
  Filled 2019-07-26: qty 1

## 2019-07-26 MED ORDER — FENTANYL CITRATE (PF) 100 MCG/2ML IJ SOLN: 50.0000 ug | Freq: Once | INTRAMUSCULAR | Status: DC

## 2019-07-26 MED ORDER — HYDROMORPHONE HCL 1 MG/ML IJ SOLN
1.0000 mg | Freq: Once | INTRAMUSCULAR | Status: AC
  Administered 2019-07-26: 1 mg via INTRAMUSCULAR
  Filled 2019-07-26: qty 1

## 2019-07-26 MED ORDER — ETOMIDATE 2 MG/ML IV SOLN
7.5000 mg | Freq: Once | INTRAVENOUS | Status: AC
  Administered 2019-07-26: 15:00:00 7.5 mg via INTRAVENOUS

## 2019-07-26 MED ORDER — ONDANSETRON HCL 4 MG/2ML IJ SOLN
4.0000 mg | Freq: Once | INTRAMUSCULAR | Status: AC
  Administered 2019-07-26: 4 mg via INTRAVENOUS
  Filled 2019-07-26: qty 2

## 2019-07-26 MED ORDER — FENTANYL CITRATE (PF) 100 MCG/2ML IJ SOLN
50.0000 ug | Freq: Once | INTRAMUSCULAR | Status: AC
  Administered 2019-07-26: 15:00:00 50 ug via INTRAVENOUS
  Filled 2019-07-26: qty 2

## 2019-07-26 MED ORDER — OXYCODONE-ACETAMINOPHEN 5-325 MG PO TABS
1.0000 | ORAL_TABLET | ORAL | Status: DC | PRN
  Administered 2019-07-26: 1 via ORAL
  Filled 2019-07-26: qty 1

## 2019-07-26 MED ORDER — ETOMIDATE 2 MG/ML IV SOLN
INTRAVENOUS | Status: AC
  Administered 2019-07-26: 7.5 mg via INTRAVENOUS
  Filled 2019-07-26: qty 10

## 2019-07-26 NOTE — ED Notes (Signed)
Patient transported to X-ray 

## 2019-07-26 NOTE — ED Provider Notes (Signed)
[facility]ERGENCY DEPARTMENT Provider Note   CSN: [id number] Arrival date & time: 07/26/19  1104     History   Chief Complaint Chief Complaint  Patient presents with   Wrist Injury    HPI Cheryl Levy is a 68 y.o. female.     68yo right-handed F w/ h/o HTN, thyroid disease, breast CA who p/w R wrist injury. Just PTA, pt had a mechanical fall onto her outstretched right hand and had sudden onset of severe R wrist pain w/ swelling. Normal sensation of hand. No other injuries. No anticoagulant use, no meds PTA.  The history is provided by the patient.  Wrist Injury   Past Medical History:  Diagnosis Date   Cancer 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      Home Medications    Prior to Admission medications   Medication Sig Start Date End Date Taking? Authorizing Provider  amphetamine-dextroamphetamine (ADDERALL) 10 MG tablet Take 1 tablet (10 mg total) by mouth daily before breakfast. 07/29/17 08/28/17  Dohmeier, Asencion Partridge, MD  amphetamine-dextroamphetamine (ADDERALL) 10 MG tablet Take 1 tablet (10 mg total) by mouth daily before breakfast. 09/29/17 10/29/17  Dohmeier, Asencion Partridge, MD  amphetamine-dextroamphetamine (ADDERALL) 10 MG tablet Take 1 tablet (10 mg total) by mouth daily before breakfast. 08/29/17 09/28/17  Dohmeier, Asencion Partridge, MD  anastrozole (ARIMIDEX) 1 MG tablet Take 1 mg by mouth daily.  09/03/14   [provider]  beclomethasone (QVAR) 80 MCG/ACT inhaler Inhale 2 puffs into the lungs 2 (two) times daily. Patient taking differently: Inhale 2 puffs into the lungs 2 (two) times daily as needed (for  breathing).  05/23/14   Davonna Belling, MD  cloNIDine (CATAPRES) 0.2 MG tablet Take 0.2 mg by mouth 2 (two) times daily.    [provider]  DULoxetine (CYMBALTA) 60 MG capsule Take 60 mg by mouth. 02/03/17   [provider]  eszopiclone (LUNESTA) 1 MG TABS tablet Take 3 mg by mouth at bedtime as needed for sleep. Take immediately before bedtime    [provider]  Fluticasone-Salmeterol (ADVAIR) 500-50 MCG/DOSE AEPB Inhale 1 puff into the lungs 2 (two) times daily as needed (for breathing).    [provider]  metaxalone (SKELAXIN) 800 MG tablet Take 400 mg by mouth 3 (three) times daily.    [provider]  methylphenidate (RITALIN) 10 MG tablet Take 10 mg by mouth daily.    [provider]  omeprazole (PRILOSEC) 40 MG capsule Take 40 mg by mouth 2 (two) times daily.     [provider]  oxyCODONE-acetaminophen (PERCOCET) 5-325 MG tablet Take 2 tablets by mouth every 4 (four) hours as needed for up to 3 days. 07/26/19 07/29/19  Anaika Santillano, Wenda Overland, MD  pregabalin (LYRICA) 200 MG capsule Take 600 mg by mouth at bedtime.    [provider]  protriptyline (VIVACTIL) 10 MG tablet Take 10 mg by mouth. 05/19/17   [provider]    Family History No family history on file.  Social History Social History   Tobacco  Use   Smoking status: Never Smoker   Smokeless tobacco: Never Used  Substance Use Topics   Alcohol use: No   Drug use: No     Allergies   Ace inhibitors   Review of Systems Review of Systems All other systems reviewed and are negative except that which was mentioned in HPI   Physical Exam Updated Vital Signs BP 136/81    Pulse 82    Temp 97.9 F (36.6 C) (Oral)    Resp 18    Ht 5\' 6"  (1.676 m)    Wt 74.4 kg    SpO2 97%    BMI 26.47 kg/m   Physical Exam Vitals signs and nursing note reviewed.  Constitutional:      General: She is not in acute distress.    Appearance: She is  well-developed.  HENT:     Head: Normocephalic and atraumatic.  Eyes:     Conjunctiva/sclera: Conjunctivae normal.  Neck:     Musculoskeletal: Neck supple.  Cardiovascular:     Pulses: Normal pulses.  Musculoskeletal:        General: Swelling, tenderness and deformity present.     Comments: Closed, dinner fork deformity of R wrist with associated swelling and tenderness; normal sensation hand, normal ROM at elbow, no tenderness of proximal R radius/ulna/elbow/shoulder  Skin:    General: Skin is warm and dry.  Neurological:     Mental Status: She is alert and oriented to person, place, and time.     Sensory: No sensory deficit.  Psychiatric:        Judgment: Judgment normal.      ED Treatments / Results  Labs (all labs ordered are listed, but only abnormal results are displayed) Labs Reviewed - No data to display  EKG None  Radiology Dg Forearm Right  Result Date: 07/26/2019 CLINICAL DATA:  Right wrist pain and swelling after fall. EXAM: RIGHT FOREARM - 2 VIEW COMPARISON:  None. FINDINGS: There is noted posterior displacement of comminuted distal right radial fracture with probable intra-articular extension. Moderately displaced ulnar styloid fracture is noted. More proximal aspects of the right radius and ulna are unremarkable. No definite soft tissue abnormality is noted. IMPRESSION: Posterior displacement of comminuted distal right radial fracture with probable intra-articular extension. Moderately displaced ulnar styloid fracture. Electronically Signed   By: Marijo Conception M.D.   On: 07/26/2019 11:46   Dg Wrist Complete Right  Result Date: 07/26/2019 CLINICAL DATA:  Right wrist pain and swelling after fall. EXAM: RIGHT WRIST - COMPLETE 3+ VIEW COMPARISON:  None. FINDINGS: There is posterior displacement of comminuted fracture seen involving the distal right radius. Mildly displaced ulnar styloid fracture is noted. Widening of scapholunate space is noted. IMPRESSION:  Posterior displacement of comminuted distal right radial fracture. Widening of scapholunate space is noted suggesting injury of scapholunate ligament. Electronically Signed   By: Marijo Conception M.D.   On: 07/26/2019 11:45    Procedures Reduction of fracture  Date/Time: 07/26/2019 3:52 PM Performed by: Sharlett Iles, MD Authorized by: Sharlett Iles, MD  Consent: Verbal consent obtained. Written consent obtained. Risks and benefits: risks, benefits and alternatives were discussed Required items: required blood products, implants, devices, and special equipment available Patient identity confirmed: verbally with patient and arm band Time out: Immediately prior to procedure a "time out" was called to verify the correct patient, procedure, equipment, support staff and site/side marked as required. Local anesthesia used: no  Anesthesia: Local anesthesia used: no  Sedation: Patient  sedated: yes Sedation type: moderate (conscious) sedation Sedatives: etomidate Vitals: Vital signs were monitored during sedation.  Patient tolerance: patient tolerated the procedure well with no immediate complications Comments: Closed reduction performed using traction-counter traction  .Sedation  Date/Time: 07/26/2019 3:54 PM Performed by: Sharlett Iles, MD Authorized by: Sharlett Iles, MD   Consent:    Consent obtained:  Written and verbal   Consent given by:  Patient   Risks discussed:  Allergic reaction, inadequate sedation, nausea, vomiting, respiratory compromise necessitating ventilatory assistance and intubation, prolonged sedation necessitating reversal and prolonged hypoxia resulting in organ damage   Alternatives discussed:  Analgesia without sedation Universal protocol:    Immediately prior to procedure a time out was called: yes     Patient identity confirmation method:  Verbally with patient Indications:    Procedure performed:  Fracture reduction    Procedure necessitating sedation performed by:  Physician performing sedation Pre-sedation assessment:    Time since last food or drink:  5 hours   ASA classification: class 2 - patient with mild systemic disease     Neck mobility: normal     Mouth opening:  3 or more finger widths   Thyromental distance:  4 finger widths   Mallampati score:  III - soft palate, base of uvula visible   Pre-sedation assessments completed and reviewed: airway patency, cardiovascular function, mental status, pain level and respiratory function   Immediate pre-procedure details:    Reassessment: Patient reassessed immediately prior to procedure     Reviewed: vital signs     Verified: bag valve mask available, emergency equipment available, intubation equipment available, IV patency confirmed and oxygen available   Procedure details (see MAR for exact dosages):    Preoxygenation:  Nasal cannula   Sedation:  Etomidate   Intra-procedure monitoring:  Blood pressure monitoring, cardiac monitor, continuous pulse oximetry, continuous capnometry, frequent LOC assessments and frequent vital sign checks   Intra-procedure events: none     Total Provider sedation time (minutes):  15 Post-procedure details:    Attendance: Constant attendance by certified staff until patient recovered     Recovery: Patient returned to pre-procedure baseline     Post-sedation assessments completed and reviewed: airway patency, mental status, nausea/vomiting, pain level and respiratory function     Patient is stable for discharge or admission: yes     Patient tolerance:  Tolerated well, no immediate complications   (including critical care time)  Medications Ordered in ED Medications  oxyCODONE-acetaminophen (PERCOCET/ROXICET) 5-325 MG per tablet 1 tablet (1 tablet Oral Given 07/26/19 1130)  HYDROmorphone (DILAUDID) injection 1 mg (1 mg Intramuscular Given 07/26/19 1205)  ondansetron (ZOFRAN) injection 4 mg (4 mg Intravenous Given 07/26/19  1448)  etomidate (AMIDATE) injection 7.5 mg (7.5 mg Intravenous Given 07/26/19 1449)  fentaNYL (SUBLIMAZE) injection 50 mcg (50 mcg Intravenous Given 07/26/19 1508)  oxyCODONE-acetaminophen (PERCOCET/ROXICET) 5-325 MG per tablet 1 tablet (1 tablet Oral Given 07/26/19 1548)     Initial Impression / Assessment and Plan / ED Course  I have reviewed the triage vital signs and the nursing notes.  Pertinent imaging results that were available during my care of the patient were reviewed by me and considered in my medical decision making (see chart for details).         Neurovascularly intact distally. XR show comminuted right distal radius fracture with intra-articular extension and posterior displacement as well as displaced ulnar styloid fracture.  I discussed injury with hand surgeon on-call, Dr. Apolonio Schneiders, who recommended bedside  closed reduction with no need for postreduction films.  He agreed with plan to place in sugar tong sling.  He will see the patient tomorrow in clinic to discuss operative management.  I discussed options with patient who elected for procedural sedation as opposed to a hematoma block.  See procedure note for details.  She was awake and alert on reassessment.  I have provided with pain medication and reviewed return precautions.  Final Clinical Impressions(s) / ED Diagnoses   Final diagnoses:  Other closed intra-articular fracture of distal end of right radius, initial encounter  Closed displaced fracture of styloid process of right ulna, initial encounter    ED Discharge Orders         Ordered    oxyCODONE-acetaminophen (PERCOCET) 5-325 MG tablet  Every 4 hours PRN     07/26/19 1551           Eastyn Skalla, Wenda Overland, MD 07/26/19 1556

## 2019-07-26 NOTE — ED Notes (Signed)
Pt remains awake, alert, ready for discharge home.  Verbalizes understanding of discharge instructions and follow up care.  Family member present to provide transportation home.

## 2019-07-26 NOTE — Sedation Documentation (Signed)
Pt awake alert, answers all questions appropriately, reports increased pain to right wrist following procedure.  Medicated as ordered with fentanyl

## 2019-07-26 NOTE — ED Notes (Signed)
Pt with venipuncture restriction for left arm, due to injury on right arm, unable to insert IV.  MD aware, consulting with Hand specialist at [facility] to discuss plan.  Given Dilaudid IM along with percocet po as ordered for pain management.

## 2019-07-26 NOTE — ED Triage Notes (Addendum)
Pt states she tripped/fell ~1 hour PTA-pain to left wrist-grimacing-holding arm-states she took advil 600mg  PTA-wrist deformity noted

## 2019-07-26 NOTE — ED Notes (Signed)
Per MD to perform sedation for reduction of right wrist.  MD approved IV insertion to left arm for medication.  Resp therapy aware.  Tech aware.

## 2019-07-26 NOTE — Sedation Documentation (Signed)
Reduction completed by MD and splinting in progress. Pt tolerating well, responds to verbal.

## 2019-09-13 DIAGNOSIS — K219 Gastro-esophageal reflux disease without esophagitis: Secondary | ICD-10-CM | POA: Diagnosis present

## 2020-01-25 DIAGNOSIS — E0590 Thyrotoxicosis, unspecified without thyrotoxic crisis or storm: Secondary | ICD-10-CM | POA: Diagnosis present

## 2020-02-06 DIAGNOSIS — E042 Nontoxic multinodular goiter: Secondary | ICD-10-CM | POA: Diagnosis present

## 2020-06-06 ENCOUNTER — Emergency Department (HOSPITAL_BASED_OUTPATIENT_CLINIC_OR_DEPARTMENT_OTHER)
Admission: EM | Admit: 2020-06-06 | Discharge: 2020-06-06 | Disposition: A | Discharge status: Home / Self Care | Payer: Medicare HMO | Attending: Emergency Medicine | Admitting: Emergency Medicine

## 2020-06-06 ENCOUNTER — Encounter (HOSPITAL_BASED_OUTPATIENT_CLINIC_OR_DEPARTMENT_OTHER): Payer: Self-pay

## 2020-06-06 ENCOUNTER — Emergency Department (HOSPITAL_BASED_OUTPATIENT_CLINIC_OR_DEPARTMENT_OTHER): Payer: Medicare HMO

## 2020-06-06 ENCOUNTER — Other Ambulatory Visit: Payer: Self-pay

## 2020-06-06 DIAGNOSIS — Y999 Unspecified external cause status: Secondary | ICD-10-CM | POA: Diagnosis not present

## 2020-06-06 DIAGNOSIS — I10 Essential (primary) hypertension: Secondary | ICD-10-CM | POA: Diagnosis not present

## 2020-06-06 DIAGNOSIS — C50911 Malignant neoplasm of unspecified site of right female breast: Secondary | ICD-10-CM | POA: Insufficient documentation

## 2020-06-06 DIAGNOSIS — Y9289 Other specified places as the place of occurrence of the external cause: Secondary | ICD-10-CM | POA: Insufficient documentation

## 2020-06-06 DIAGNOSIS — Y939 Activity, unspecified: Secondary | ICD-10-CM | POA: Diagnosis not present

## 2020-06-06 DIAGNOSIS — W19XXXA Unspecified fall, initial encounter: Secondary | ICD-10-CM | POA: Diagnosis not present

## 2020-06-06 DIAGNOSIS — S80912A Unspecified superficial injury of left knee, initial encounter: Secondary | ICD-10-CM | POA: Diagnosis present

## 2020-06-06 DIAGNOSIS — S8002XA Contusion of left knee, initial encounter: Secondary | ICD-10-CM

## 2020-06-06 NOTE — ED Provider Notes (Signed)
[facility] EMERGENCY DEPARTMENT Provider Note   CSN: [id number] Arrival date & time: 06/06/20  1224     History Chief Complaint  Patient presents with  . Fall    Cheryl Levy is a 69 y.o. female.  The history is provided by the patient.  Fall This is a new problem. The current episode started more than 2 days ago. The problem occurs constantly. The problem has been gradually improving. Pertinent negatives include no chest pain, no abdominal pain, no headaches and no shortness of breath. Nothing aggravates the symptoms. Nothing relieves the symptoms. She has tried acetaminophen for the symptoms.       Past Medical History:  Diagnosis Date  . Cancer [facility])    breast cancer  . Hypertension   . Thyroid disease     Patient Active Problem List   Diagnosis Date Noted  . 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 ABDOMINAL HYSTERECTOMY    . BACK SURGERY    . JOINT REPLACEMENT    . MASTECTOMY Bilateral   . THYROIDECTOMY       OB History   No obstetric history on file.     No family history on file.  Social History   Tobacco Use  . Smoking status: Never Smoker  . Smokeless tobacco: Never Used  Vaping Use  . Vaping Use: Never used  Substance Use Topics  . Alcohol use: No  . Drug use: No    Home Medications Prior to Admission medications   Medication Sig Start Date End Date Taking? Authorizing Provider  amphetamine-dextroamphetamine (ADDERALL) 10 MG tablet Take 1 tablet (10 mg total) by mouth daily before breakfast. 07/29/17 08/28/17  Dohmeier, Asencion Partridge, MD  amphetamine-dextroamphetamine (ADDERALL) 10 MG tablet Take 1 tablet (10 mg total) by mouth daily before breakfast. 09/29/17 10/29/17  Dohmeier, Asencion Partridge, MD  amphetamine-dextroamphetamine (ADDERALL) 10 MG tablet Take 1 tablet (10 mg total) by mouth daily before breakfast. 08/29/17 09/28/17  Dohmeier, Asencion Partridge, MD  anastrozole (ARIMIDEX) 1 MG tablet  Take 1 mg by mouth daily.  09/03/14   [provider]  beclomethasone (QVAR) 80 MCG/ACT inhaler Inhale 2 puffs into the lungs 2 (two) times daily. Patient taking differently: Inhale 2 puffs into the lungs 2 (two) times daily as needed (for breathing).  05/23/14   Davonna Belling, MD  cloNIDine (CATAPRES) 0.2 MG tablet Take 0.2 mg by mouth 2 (two) times daily.    [provider]  DULoxetine (CYMBALTA) 60 MG capsule Take 60 mg by mouth. 02/03/17   [provider]  eszopiclone (LUNESTA) 1 MG TABS tablet Take 3 mg by mouth at bedtime as needed for sleep. Take immediately before bedtime    [provider]  Fluticasone-Salmeterol (ADVAIR) 500-50 MCG/DOSE AEPB Inhale 1 puff into the lungs 2 (two) times daily as needed (for breathing).    [provider]  metaxalone (SKELAXIN) 800 MG tablet Take 400 mg by mouth 3 (three) times daily.    [provider]  methylphenidate (RITALIN) 10 MG tablet Take 10 mg by mouth daily.    [provider]  omeprazole (PRILOSEC) 40 MG capsule Take 40 mg by mouth 2 (two) times daily.     [provider]  pregabalin (LYRICA) 200 MG capsule Take 600 mg by mouth at bedtime.    [provider]  protriptyline (VIVACTIL) 10 MG tablet Take 10 mg by mouth. 05/19/17   [provider]    Allergies  Ace inhibitors  Review of Systems   Review of Systems  Constitutional: Negative for fever.  Respiratory: Negative for shortness of breath.   Cardiovascular: Negative for chest pain.  Gastrointestinal: Negative for abdominal pain.  Musculoskeletal: Positive for arthralgias and joint swelling.  Skin: Positive for color change.  Neurological: Negative for headaches.    Physical Exam Updated Vital Signs BP (!) 140/61 (BP Location: Left Arm)   Pulse 77   Temp 98.5 F (36.9 C) (Oral)   Resp 18   Ht 5\' 7"  (1.702 m)   Wt 73 kg   SpO2 100%   BMI 25.22 kg/m   Physical Exam Constitutional:       General: She is not in acute distress.    Appearance: She is not ill-appearing.  Cardiovascular:     Pulses: Normal pulses.  Musculoskeletal:        General: Swelling and tenderness present.     Comments: TTP to left knee with some mild swelling and bruising around  Skin:    Findings: Bruising present.  Neurological:     General: No focal deficit present.     Mental Status: She is alert.     Comments: 5+/5 strength in LLE, normal sensation      ED Results / Procedures / Treatments   Labs (all labs ordered are listed, but only abnormal results are displayed) Labs Reviewed - No data to display  EKG None  Radiology DG Knee Complete 4 Views Left  Result Date: 06/06/2020 CLINICAL DATA:  Fall, landed on left knee 1 week ago. EXAM: LEFT KNEE - COMPLETE 4+ VIEW COMPARISON:  None. FINDINGS: No acute bony abnormality. Specifically, no fracture, subluxation, or dislocation. No joint effusion. Joint spaces maintained. Soft tissues intact. IMPRESSION: Negative. Electronically Signed   By: Rolm Baptise M.D.   On: 06/06/2020 13:15    Procedures Procedures (including critical care time)  Medications Ordered in ED Medications - No data to display  ED Course  I have reviewed the triage vital signs and the nursing notes.  Pertinent labs & imaging results that were available during my care of the patient were reviewed by me and considered in my medical decision making (see chart for details).    MDM Rules/Calculators/A&P                          Cheryl Levy is a 69 year old female presents the ED after fall about a week ago.  With some mild swelling and bruising to the left knee.  Has been ambulatory.  However did not get it x-rayed.  Wanted evaluation due to bruising and some mild swelling.  Vital signs are normal.  X-ray showed no acute fracture.  No major joint effusion.  Overall appears to be well-healing knee contusion.  Ambulating without any issues.  Recommend continued use of  ice, Motrin, Tylenol.  Discharged in good condition.  This chart was dictated using voice recognition software.  Despite best efforts to proofread,  errors can occur which can change the documentation meaning.   Final Clinical Impression(s) / ED Diagnoses Final diagnoses:  Contusion of left knee, initial encounter    Rx / DC Orders ED Discharge Orders    None       Lennice Sites, DO 06/06/20 1510

## 2020-06-06 NOTE — ED Triage Notes (Signed)
Pt states she fell/injured left knee 1 week ago-c/o large amount of bruising and swelling-NAD-steady gait

## 2020-08-01 DIAGNOSIS — E538 Deficiency of other specified B group vitamins: Secondary | ICD-10-CM | POA: Diagnosis present

## 2020-08-13 ENCOUNTER — Emergency Department (HOSPITAL_BASED_OUTPATIENT_CLINIC_OR_DEPARTMENT_OTHER): Payer: Medicare HMO

## 2020-08-13 ENCOUNTER — Encounter (HOSPITAL_BASED_OUTPATIENT_CLINIC_OR_DEPARTMENT_OTHER): Payer: Self-pay | Admitting: Emergency Medicine

## 2020-08-13 ENCOUNTER — Other Ambulatory Visit: Payer: Self-pay

## 2020-08-13 ENCOUNTER — Emergency Department (HOSPITAL_BASED_OUTPATIENT_CLINIC_OR_DEPARTMENT_OTHER)
Admission: EM | Admit: 2020-08-13 | Discharge: 2020-08-13 | Disposition: A | Discharge status: Home / Self Care | Payer: Medicare HMO | Attending: Emergency Medicine | Admitting: Emergency Medicine

## 2020-08-13 DIAGNOSIS — Z79899 Other long term (current) drug therapy: Secondary | ICD-10-CM | POA: Diagnosis not present

## 2020-08-13 DIAGNOSIS — I10 Essential (primary) hypertension: Secondary | ICD-10-CM | POA: Insufficient documentation

## 2020-08-13 DIAGNOSIS — Z853 Personal history of malignant neoplasm of breast: Secondary | ICD-10-CM | POA: Insufficient documentation

## 2020-08-13 DIAGNOSIS — Z20822 Contact with and (suspected) exposure to covid-19: Secondary | ICD-10-CM | POA: Diagnosis not present

## 2020-08-13 DIAGNOSIS — R519 Headache, unspecified: Secondary | ICD-10-CM | POA: Insufficient documentation

## 2020-08-13 DIAGNOSIS — Z96653 Presence of artificial knee joint, bilateral: Secondary | ICD-10-CM | POA: Diagnosis not present

## 2020-08-13 DIAGNOSIS — R059 Cough, unspecified: Secondary | ICD-10-CM | POA: Diagnosis present

## 2020-08-13 DIAGNOSIS — J069 Acute upper respiratory infection, unspecified: Secondary | ICD-10-CM | POA: Insufficient documentation

## 2020-08-13 DIAGNOSIS — M791 Myalgia, unspecified site: Secondary | ICD-10-CM | POA: Insufficient documentation

## 2020-08-13 LAB — RESP PANEL BY RT PCR (RSV, FLU A&B, COVID)
Influenza A by PCR: NEGATIVE
Influenza B by PCR: NEGATIVE
Respiratory Syncytial Virus by PCR: NEGATIVE
SARS Coronavirus 2 by RT PCR: NEGATIVE

## 2020-08-13 NOTE — Discharge Instructions (Signed)
I recommend taking over-the-counter pain medication like ibuprofen every 6 as needed for pain please follow dosing the back of bottle.  Also recommend taking Tylenol for fever control every 6 as needed please father's in the back of bottle.  For your nasal congestion I recommend Claritin and nasal spray.  Please stay hydrated and if you do not have an appetite I recommend soups as this provides you with fluids as well as calories.  Your Covid test is pending please self quarantine until you get your results back seen on MyChart.  If Covid positive you must self quarantine for 10 days starting on symptom onset.  I want you to call post Covid care for further evaluation if Covid positive.  I want you to follow-up with your PCP if symptoms not fully resolved in 2 weeks time.  Come back to the emergency department if you develop severe chest pain, shortness of breath, severe abdominal pain, uncontrolled nausea, vomiting or diarrhea.

## 2020-08-13 NOTE — ED Triage Notes (Signed)
Cough, headache, body aches x 3 days.

## 2020-08-13 NOTE — ED Provider Notes (Signed)
[facility] EMERGENCY DEPARTMENT Provider Note   CSN: [id number] Arrival date & time: 08/13/20  1025     History Chief Complaint  Patient presents with  . Cough    Cheryl Levy is a 69 y.o. female.  HPI   Patient with significant medical history of breast cancer, hypertension, thyroid disease presents to the emergency department with chief complaint of cough, congestion, fevers and general body aches.  Patient states this all started on Saturday and she feels like she is progressively gotten worse.  Patient is endorse a slight headache as well as a dry nonproductive cough.  She states she is Covid vaccinated, denies recent sick contacts, no recent travels.  She has not been taking any medication for her symptoms.  She denies chest pain, shortness of breath, abdominal pain, nausea, vomiting, diarrhea, dysuria.  Patient denies any alleviating or aggravating factors at this time.  Patient denies ear pain, chest pain, shortness of breath, abdominal pain, nausea, vomiting, diarrhea, pedal edema.  Past Medical History:  Diagnosis Date  . Cancer [facility])    breast cancer  . Hypertension   . Thyroid disease     Patient Active Problem List   Diagnosis Date Noted  . 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 ABDOMINAL HYSTERECTOMY    . BACK SURGERY    . JOINT REPLACEMENT    . MASTECTOMY Bilateral   . THYROIDECTOMY       OB History   No obstetric history on file.     No family history on file.  Social History   Tobacco Use  . Smoking status: Never Smoker  . Smokeless tobacco: Never Used  Vaping Use  . Vaping Use: Never used  Substance Use Topics  . Alcohol use: No  . Drug use: No    Home Medications Prior to Admission medications   Medication Sig Start Date End Date Taking? Authorizing Provider  amphetamine-dextroamphetamine (ADDERALL) 10 MG tablet Take 1 tablet (10 mg total) by mouth daily before  breakfast. 07/29/17 08/28/17  Dohmeier, Asencion Partridge, MD  amphetamine-dextroamphetamine (ADDERALL) 10 MG tablet Take 1 tablet (10 mg total) by mouth daily before breakfast. 09/29/17 10/29/17  Dohmeier, Asencion Partridge, MD  amphetamine-dextroamphetamine (ADDERALL) 10 MG tablet Take 1 tablet (10 mg total) by mouth daily before breakfast. 08/29/17 09/28/17  Dohmeier, Asencion Partridge, MD  anastrozole (ARIMIDEX) 1 MG tablet Take 1 mg by mouth daily.  09/03/14   [provider]  beclomethasone (QVAR) 80 MCG/ACT inhaler Inhale 2 puffs into the lungs 2 (two) times daily. Patient taking differently: Inhale 2 puffs into the lungs 2 (two) times daily as needed (for breathing).  05/23/14   Davonna Belling, MD  cloNIDine (CATAPRES) 0.2 MG tablet Take 0.2 mg by mouth 2 (two) times daily.    [provider]  DULoxetine (CYMBALTA) 60 MG capsule Take 60 mg by mouth. 02/03/17   [provider]  eszopiclone (LUNESTA) 1 MG TABS tablet Take 3 mg by mouth at bedtime as needed for sleep. Take immediately before bedtime    [provider]  Fluticasone-Salmeterol (ADVAIR) 500-50 MCG/DOSE AEPB Inhale 1 puff into the lungs 2 (two) times daily as needed (for breathing).    [provider]  metaxalone (SKELAXIN) 800 MG tablet Take 400 mg by mouth 3 (three) times daily.    [provider]  methylphenidate (RITALIN) 10 MG tablet Take 10 mg by mouth daily.    [provider]  omeprazole ([place])  40 MG capsule Take 40 mg by mouth 2 (two) times daily.     [provider]  pregabalin (LYRICA) 200 MG capsule Take 600 mg by mouth at bedtime.    [provider]  protriptyline (VIVACTIL) 10 MG tablet Take 10 mg by mouth. 05/19/17   [provider]    Allergies    Ace inhibitors  Review of Systems   Review of Systems  Constitutional: Positive for chills and fever.  HENT: Negative for congestion, tinnitus, trouble swallowing and voice change.   Respiratory: Positive  for cough. Negative for shortness of breath.   Cardiovascular: Negative for chest pain.  Gastrointestinal: Negative for abdominal pain, diarrhea, nausea and vomiting.  Genitourinary: Negative for dysuria, enuresis, flank pain, vaginal bleeding and vaginal discharge.  Musculoskeletal: Negative for back pain.  Skin: Negative for rash.  Neurological: Positive for headaches. Negative for dizziness.  Hematological: Does not bruise/bleed easily.    Physical Exam Updated Vital Signs BP (!) 166/86 (BP Location: Left Arm)   Pulse 89   Temp 98.1 F (36.7 C) (Oral)   Resp 20   Ht 5\' 5"  (1.651 m)   Wt 68.9 kg   SpO2 99%   BMI 25.29 kg/m   Physical Exam Vitals and nursing note reviewed.  Constitutional:      General: She is not in acute distress.    Appearance: She is not ill-appearing.  HENT:     Head: Normocephalic and atraumatic.     Right Ear: Tympanic membrane, ear canal and external ear normal.     Left Ear: Tympanic membrane, ear canal and external ear normal.     Ears:     Comments: TMs are visualized, no signs of infection noted, both were retracted without fluid noted.    Nose: No congestion.     Mouth/Throat:     Mouth: Mucous membranes are moist.     Pharynx: Oropharynx is clear. No oropharyngeal exudate or posterior oropharyngeal erythema.  Eyes:     General: No scleral icterus. Cardiovascular:     Rate and Rhythm: Normal rate and regular rhythm.     Pulses: Normal pulses.     Heart sounds: No murmur heard.  No friction rub. No gallop.   Pulmonary:     Effort: No respiratory distress.     Breath sounds: No wheezing, rhonchi or rales.  Abdominal:     General: There is no distension.     Palpations: Abdomen is soft.     Tenderness: There is no abdominal tenderness. There is no right CVA tenderness, left CVA tenderness or guarding.  Musculoskeletal:        General: No swelling.     Left lower leg: No edema.  Skin:    General: Skin is warm and dry.     Capillary  Refill: Capillary refill takes less than 2 seconds.     Findings: No rash.  Neurological:     Mental Status: She is alert.  Psychiatric:        Mood and Affect: Mood normal.     ED Results / Procedures / Treatments   Labs (all labs ordered are listed, but only abnormal results are displayed) Labs Reviewed  RESP PANEL BY RT PCR (RSV, FLU A&B, COVID)    EKG None  Radiology DG Chest Portable 1 View  Result Date: 08/13/2020 CLINICAL DATA:  Cough and shortness of breath EXAM: PORTABLE CHEST 1 VIEW COMPARISON:  May 23, 2014 FINDINGS: Port-A-Cath no longer present. Lungs are  clear. Heart size and pulmonary vascularity are normal. No adenopathy. There is aortic atherosclerosis. No bone lesions. IMPRESSION: Lungs clear.  Heart size normal. Aortic Atherosclerosis (ICD10-I70.0). Electronically Signed   By: Lowella Grip III M.D.   On: 08/13/2020 11:50    Procedures Procedures (including critical care time)  Medications Ordered in ED Medications - No data to display  ED Course  I have reviewed the triage vital signs and the nursing notes.  Pertinent labs & imaging results that were available during my care of the patient were reviewed by me and considered in my medical decision making (see chart for details).    MDM Rules/Calculators/A&P                          I have personally reviewed all imaging, labs and have interpreted them.  Patient presents with cough, fever, chills, general body aches since Saturday.  Patient was alert, did not appear in acute distress, vital signs reassuring.  Will order respiratory panel and portable chest x-ray.   Chest x-ray does not reveal any significant findings  I have low suspicion patient would be hospitalized if Covid positive or for URI as she has no new respiratory requirements, no signs of respiratory chest on exam, lung sounds were clear bilaterally.  Low suspicion for systemic infection as patient was nontoxic-appearing, vital signs  reassuring, no obvious source infection noted on exam, x-ray does not show any acute findings.  Low suspicion for PE as she denies chest pain, shortness of breath, no leg pain or pedal edema, no history of DVT or DVTs.  Low suspicion for strep throat, peritonsillar abscess or retropharyngeal abscess as oropharynx was visualized, no erythema or edema noted, posterior pillars were nonerythematous.  I suspect patient suffering from a viral URI, will recommend over-the-counter pain medications, and self quarantine until her Covid results are back, if positive she must self quarantine for 10 days and follow-up with post Covid care.  Patient's vital signs have remained stable, no indication for hospital mission, patient was discussed with attending who agrees assessment and plan.  Patient is given at home care she will strict return precautions.  Patient verbalized that she understood and agreed to plan. Final Clinical Impression(s) / ED Diagnoses Final diagnoses:  Viral URI with cough    Rx / DC Orders ED Discharge Orders    None       Marcello Fennel, PA-C 08/13/20 1157    Lucrezia Starch, MD 08/14/20 [phone]

## 2021-04-03 ENCOUNTER — Other Ambulatory Visit (HOSPITAL_BASED_OUTPATIENT_CLINIC_OR_DEPARTMENT_OTHER): Payer: Self-pay

## 2021-04-03 ENCOUNTER — Other Ambulatory Visit: Payer: Self-pay

## 2021-04-03 ENCOUNTER — Encounter (HOSPITAL_BASED_OUTPATIENT_CLINIC_OR_DEPARTMENT_OTHER): Payer: Self-pay | Admitting: *Deleted

## 2021-04-03 ENCOUNTER — Emergency Department (HOSPITAL_BASED_OUTPATIENT_CLINIC_OR_DEPARTMENT_OTHER)
Admission: EM | Admit: 2021-04-03 | Discharge: 2021-04-03 | Disposition: A | Discharge status: Home / Self Care | Payer: Medicare HMO | Attending: Emergency Medicine | Admitting: Emergency Medicine

## 2021-04-03 DIAGNOSIS — Z853 Personal history of malignant neoplasm of breast: Secondary | ICD-10-CM | POA: Insufficient documentation

## 2021-04-03 DIAGNOSIS — I10 Essential (primary) hypertension: Secondary | ICD-10-CM | POA: Diagnosis not present

## 2021-04-03 DIAGNOSIS — R21 Rash and other nonspecific skin eruption: Secondary | ICD-10-CM | POA: Diagnosis present

## 2021-04-03 MED ORDER — DOXYCYCLINE HYCLATE 100 MG PO CAPS
100.0000 mg | ORAL_CAPSULE | Freq: Two times a day (BID) | ORAL | 0 refills | Status: AC
Start: 2021-04-03 — End: 2021-04-10
  Filled 2021-04-03: qty 14, 7d supply, fill #0

## 2021-04-03 NOTE — Discharge Instructions (Signed)
You were seen in the emerge department today with rash on your back.  This appears to be an insect bite.  The area is inflamed and slightly swollen.  Plan to cover you for infection and potential tick exposure.  Please complete this course of antibiotics.  If you are out in the sun and taking this antibiotic it may give you a rash to try and stay covered and avoid excessive sunlight until you finish the antibiotic.   Please follow with your primary care doctor in the next 2 weeks to reevaluate the rash to make sure does not need additional referral to a dermatologist.

## 2021-04-03 NOTE — ED Provider Notes (Signed)
Emergency Department Provider Note   I have reviewed the triage vital signs and the nursing notes.   HISTORY  Chief Complaint Skin Lesion   HPI Cheryl Levy is a 70 y.o. female past medical history reviewed below presents to the emergency department with raised, itchy area on the back.  She states that the area was noted by her husband 2 days ago.  She denies any fevers, drainage, bleeding.  She is not having pain in the area.  She has some mild itching which recently began but came in today with continued redness around the area.  There is a dark center and she suspects she may have been bitten by something.  She does not remember removing any ticks or seeing insects in the area.  She has another, less inflamed area on the shoulder. No radiation of symptoms or modifying factors.   Past Medical History:  Diagnosis Date  . Cancer [facility])    breast cancer  . Hypertension   . Thyroid disease     Patient Active Problem List   Diagnosis Date Noted  . 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 ABDOMINAL HYSTERECTOMY    . BACK SURGERY    . JOINT REPLACEMENT    . MASTECTOMY Bilateral   . THYROIDECTOMY      Allergies Ace inhibitors  No family history on file.  Social History Social History   Tobacco Use  . Smoking status: Never Smoker  . Smokeless tobacco: Never Used  Vaping Use  . Vaping Use: Never used  Substance Use Topics  . Alcohol use: No  . Drug use: No    Review of Systems  Constitutional: No fever/chills Cardiovascular: Denies chest pain. Respiratory: Denies shortness of breath. Gastrointestinal: No abdominal pain.  No nausea, no vomiting.   Skin: Positive rash to back.    ____________________________________________   PHYSICAL EXAM:  VITAL SIGNS: ED Triage Vitals  Enc Vitals Group     BP 04/03/21 1204 (!) 176/82     Pulse Rate 04/03/21 1204 78     Resp 04/03/21 1204 20     Temp 04/03/21  1204 98 F (36.7 C)     Temp Source 04/03/21 1204 Oral     SpO2 04/03/21 1204 98 %     Weight 04/03/21 1204 160 lb (72.6 kg)     Height 04/03/21 1204 5\' 5"  (1.651 m)   Constitutional: Alert and oriented. Well appearing and in no acute distress. Eyes: Conjunctivae are normal.  Head: Atraumatic. Nose: No congestion/rhinnorhea. Mouth/Throat: Mucous membranes are moist. Neck: No stridor.   Cardiovascular: Good peripheral circulation.   Respiratory: Normal respiratory effort.  Gastrointestinal: No distention.  Musculoskeletal: No gross deformities of extremities. Neurologic:  Normal speech and language.  Skin: Circular, raised area to the back with erythema. Central area of scabbing. No bleeding or ulceration.   ____________________________________________   PROCEDURES  Procedure(s) performed:   Procedures  None  ____________________________________________   INITIAL IMPRESSION / ASSESSMENT AND PLAN / ED COURSE  Pertinent labs & imaging results that were available during my care of the patient were reviewed by me and considered in my medical decision making (see chart for details).   Patient presents to the emergency department with skin lesion to the back.  It is mildly itchy and raised.  There is a central area of scab and then some surrounding induration and erythema.  The area measures approximately 1.5 cm.  Seems most  consistent with insect bite.  Question tick but this was not seen by the patient.  Given the area of expanding erythema from the central darkened area I plan to cover with doxycycline.  Patient will follow with the PCP in the next 2 weeks.  If the lesion persists she may require referral to dermatology from that point.  ____________________________________________  FINAL CLINICAL IMPRESSION(S) / ED DIAGNOSES  Final diagnoses:  Rash    NEW OUTPATIENT MEDICATIONS STARTED DURING THIS VISIT:  New Prescriptions   DOXYCYCLINE (VIBRAMYCIN) 100 MG CAPSULE     Take 1 capsule (100 mg total) by mouth 2 (two) times daily for 7 days.    Note:  This document was prepared using Dragon voice recognition software and may include unintentional dictation errors.  Nanda Quinton, MD, [facility]    Jahmere Bramel, Wonda Olds, MD 04/03/21 [phone]

## 2021-04-03 NOTE — ED Triage Notes (Signed)
Her husband noticed a spot on her back that concerned him. Lesion note. Raised red circular with a black center.

## 2021-04-21 ENCOUNTER — Ambulatory Visit: Payer: Medicare HMO | Attending: Internal Medicine

## 2021-04-21 DIAGNOSIS — Z23 Encounter for immunization: Secondary | ICD-10-CM

## 2021-04-21 NOTE — Progress Notes (Signed)
   Covid-19 Vaccination Clinic  Name:  Cheryl Levy    MRN: [id number] DOB: April 28, 1951  04/21/2021  [name] was observed post Covid-19 immunization for 15 minutes without incident. She was provided with Vaccine Information Sheet and instruction to access the V-Safe system.   [name] was instructed to call 911 with any severe reactions post vaccine: Difficulty breathing  Swelling of face and throat  A fast heartbeat  A bad rash all over body  Dizziness and weakness   Immunizations Administered     Name Date Dose VIS Date Route   PFIZER Comrnaty(Gray TOP) Covid-19 Vaccine 04/21/2021 10:39 AM 0.3 mL 10/17/2020 Intramuscular   Manufacturer: [place]   Lot: [id number]   [place]: [phone]

## 2021-04-22 ENCOUNTER — Other Ambulatory Visit (HOSPITAL_BASED_OUTPATIENT_CLINIC_OR_DEPARTMENT_OTHER): Payer: Self-pay

## 2021-04-22 MED ORDER — COVID-19 MRNA VAC-TRIS(PFIZER) 30 MCG/0.3ML IM SUSP
INTRAMUSCULAR | 0 refills | Status: DC
  Filled 2021-04-22: qty 0.3, 1d supply, fill #0

## 2021-07-15 ENCOUNTER — Other Ambulatory Visit (HOSPITAL_BASED_OUTPATIENT_CLINIC_OR_DEPARTMENT_OTHER): Payer: Self-pay

## 2021-07-24 ENCOUNTER — Other Ambulatory Visit (HOSPITAL_COMMUNITY): Payer: Self-pay

## 2022-01-09 IMAGING — CR DG KNEE COMPLETE 4+V*L*
4 series · 4 of 4 positions shown · non-contrast
Comparison: None.

CLINICAL DATA: Fall, landed on left knee 1 week ago.

EXAM:
LEFT KNEE - COMPLETE 4+ VIEW

[t knee ap left]
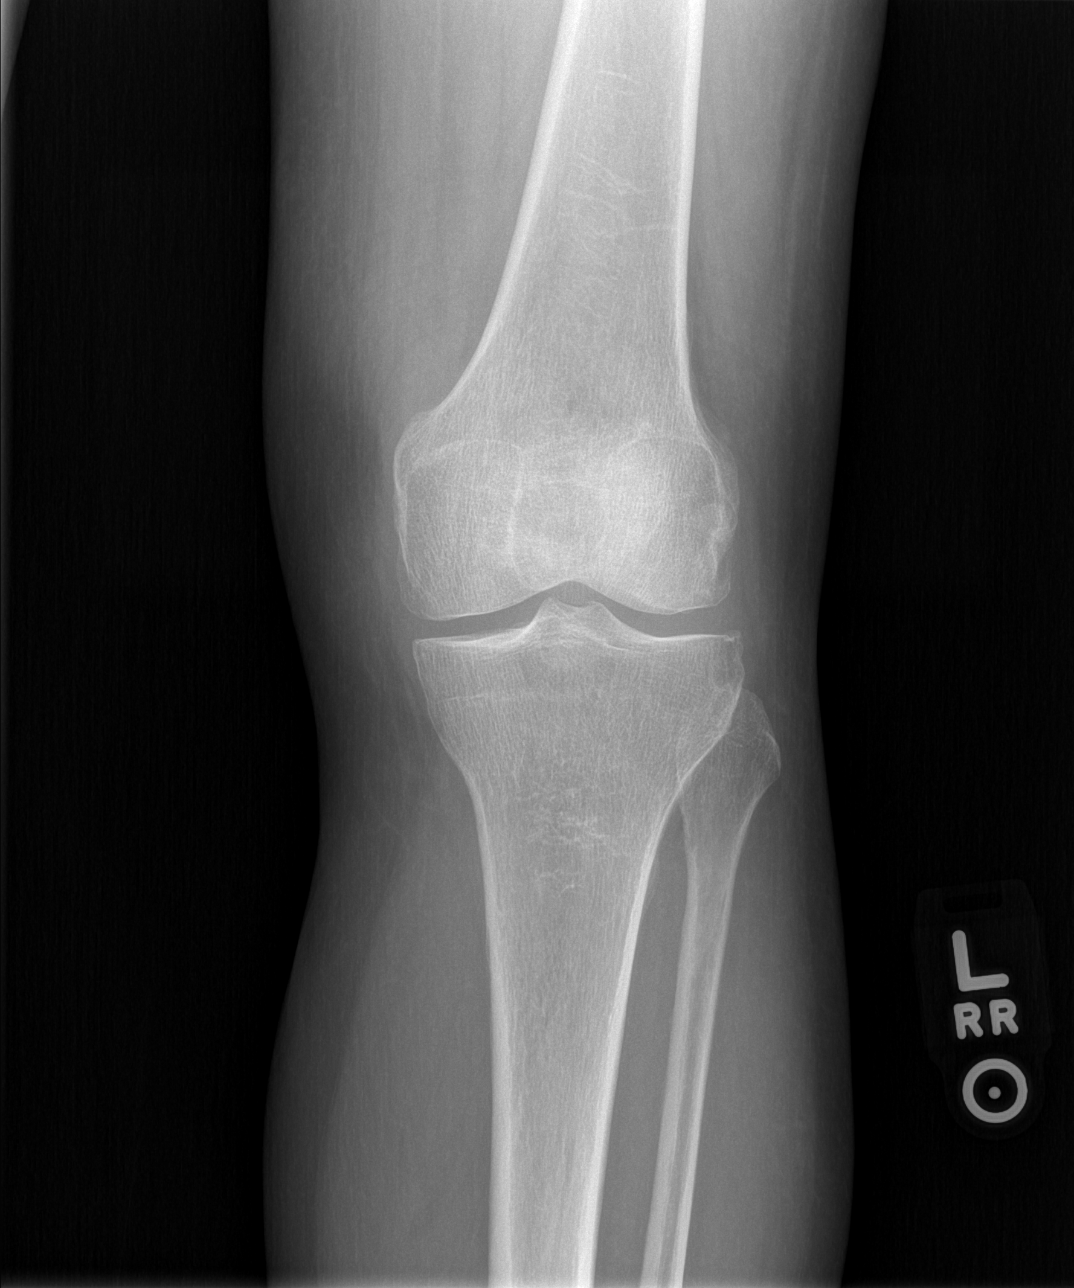

[t knee oblique left (1 of 2)]
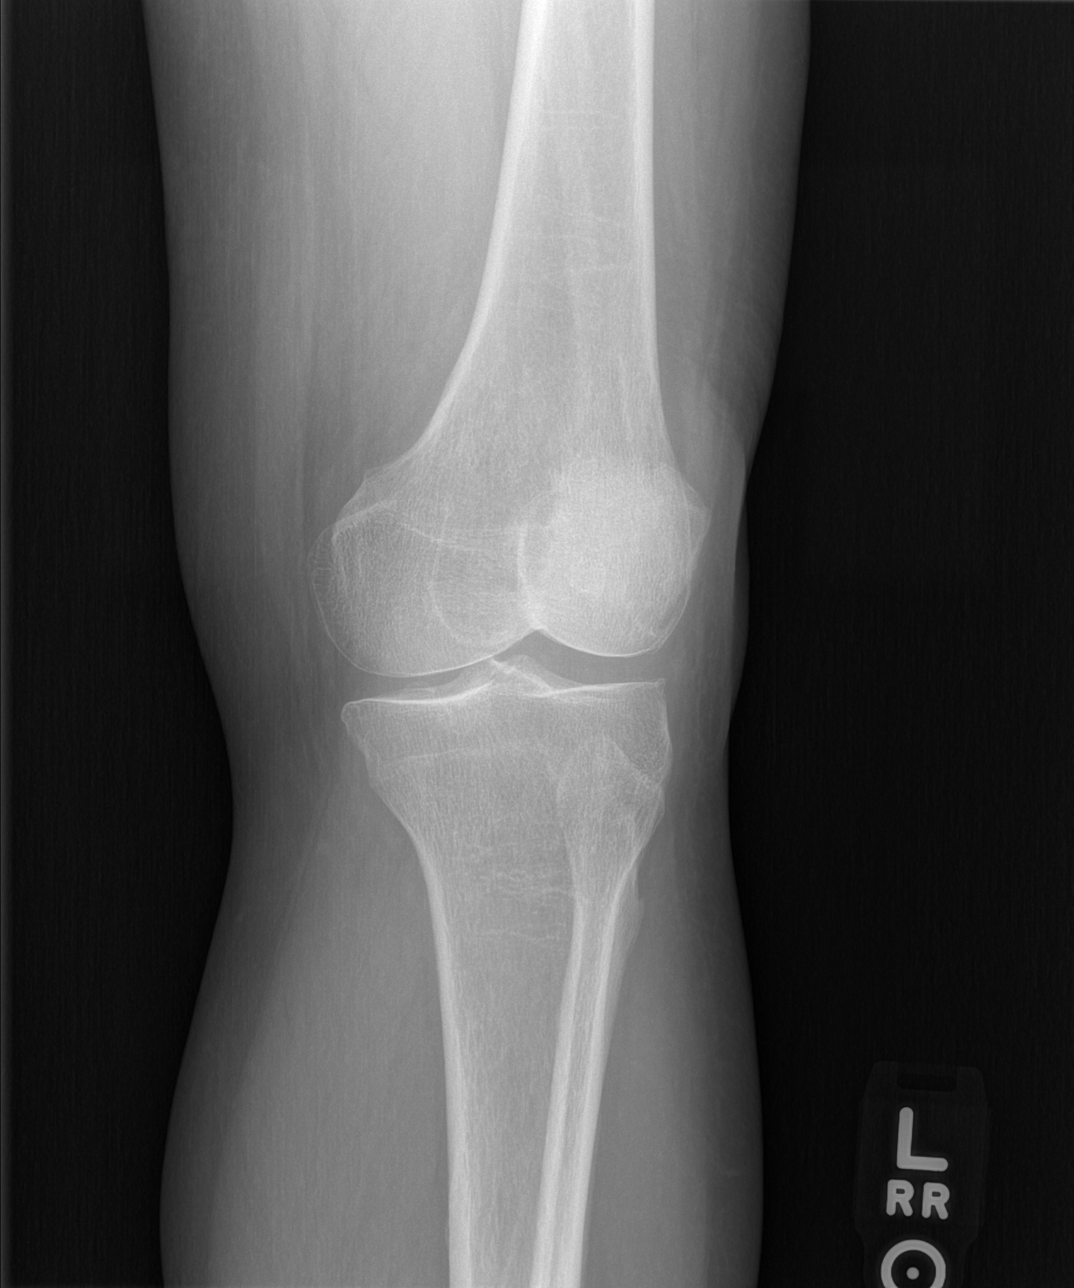

[t knee oblique left (2 of 2)]
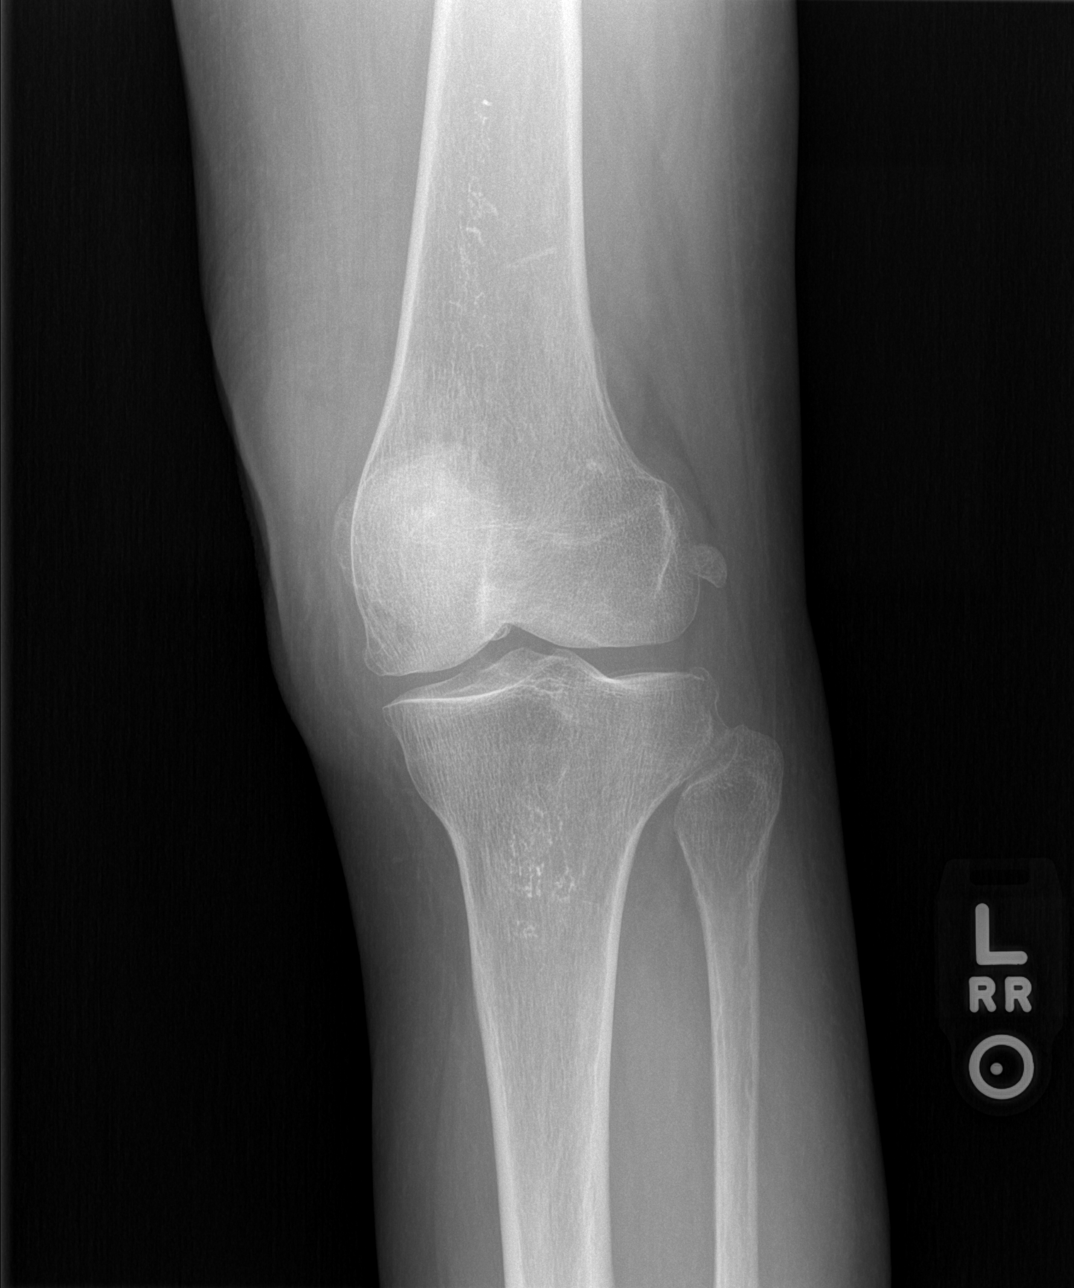

[t knee lat left]
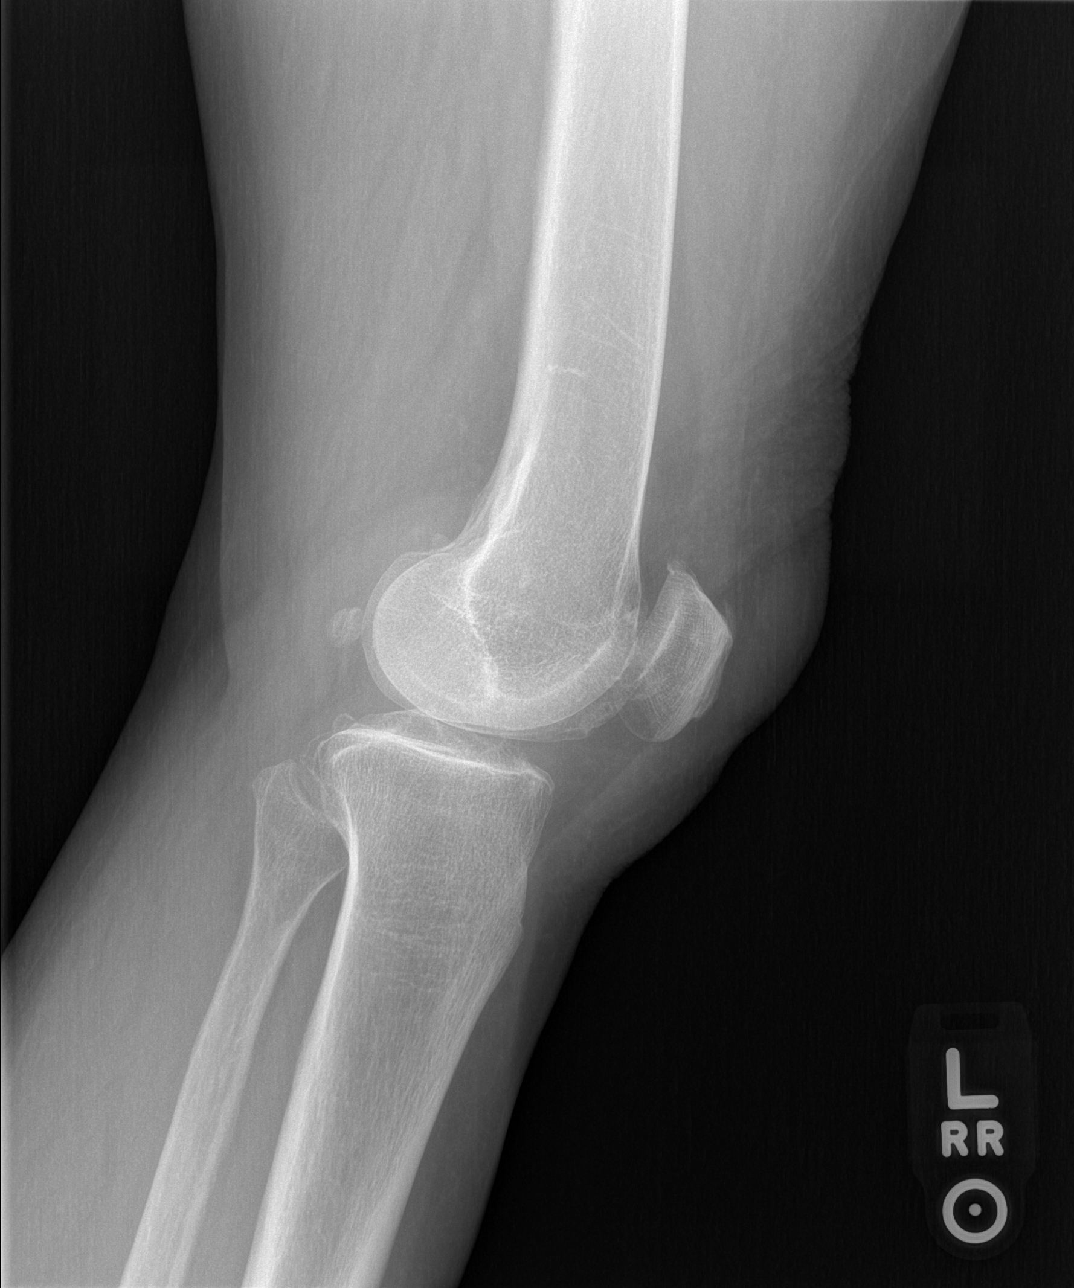

[4 of 4 positions shown; findings below may reference images not displayed]

FINDINGS: No acute bony abnormality. Specifically, no fracture, subluxation,
or dislocation. No joint effusion. Joint spaces maintained. Soft
tissues intact.
IMPRESSION: Negative.

## 2022-03-18 IMAGING — DX DG CHEST 1V PORT
1 series · 1 of 1 positions shown · non-contrast
Comparison: May 23, 2014

CLINICAL DATA: Cough and shortness of breath

EXAM:
PORTABLE CHEST 1 VIEW

[chest ap]
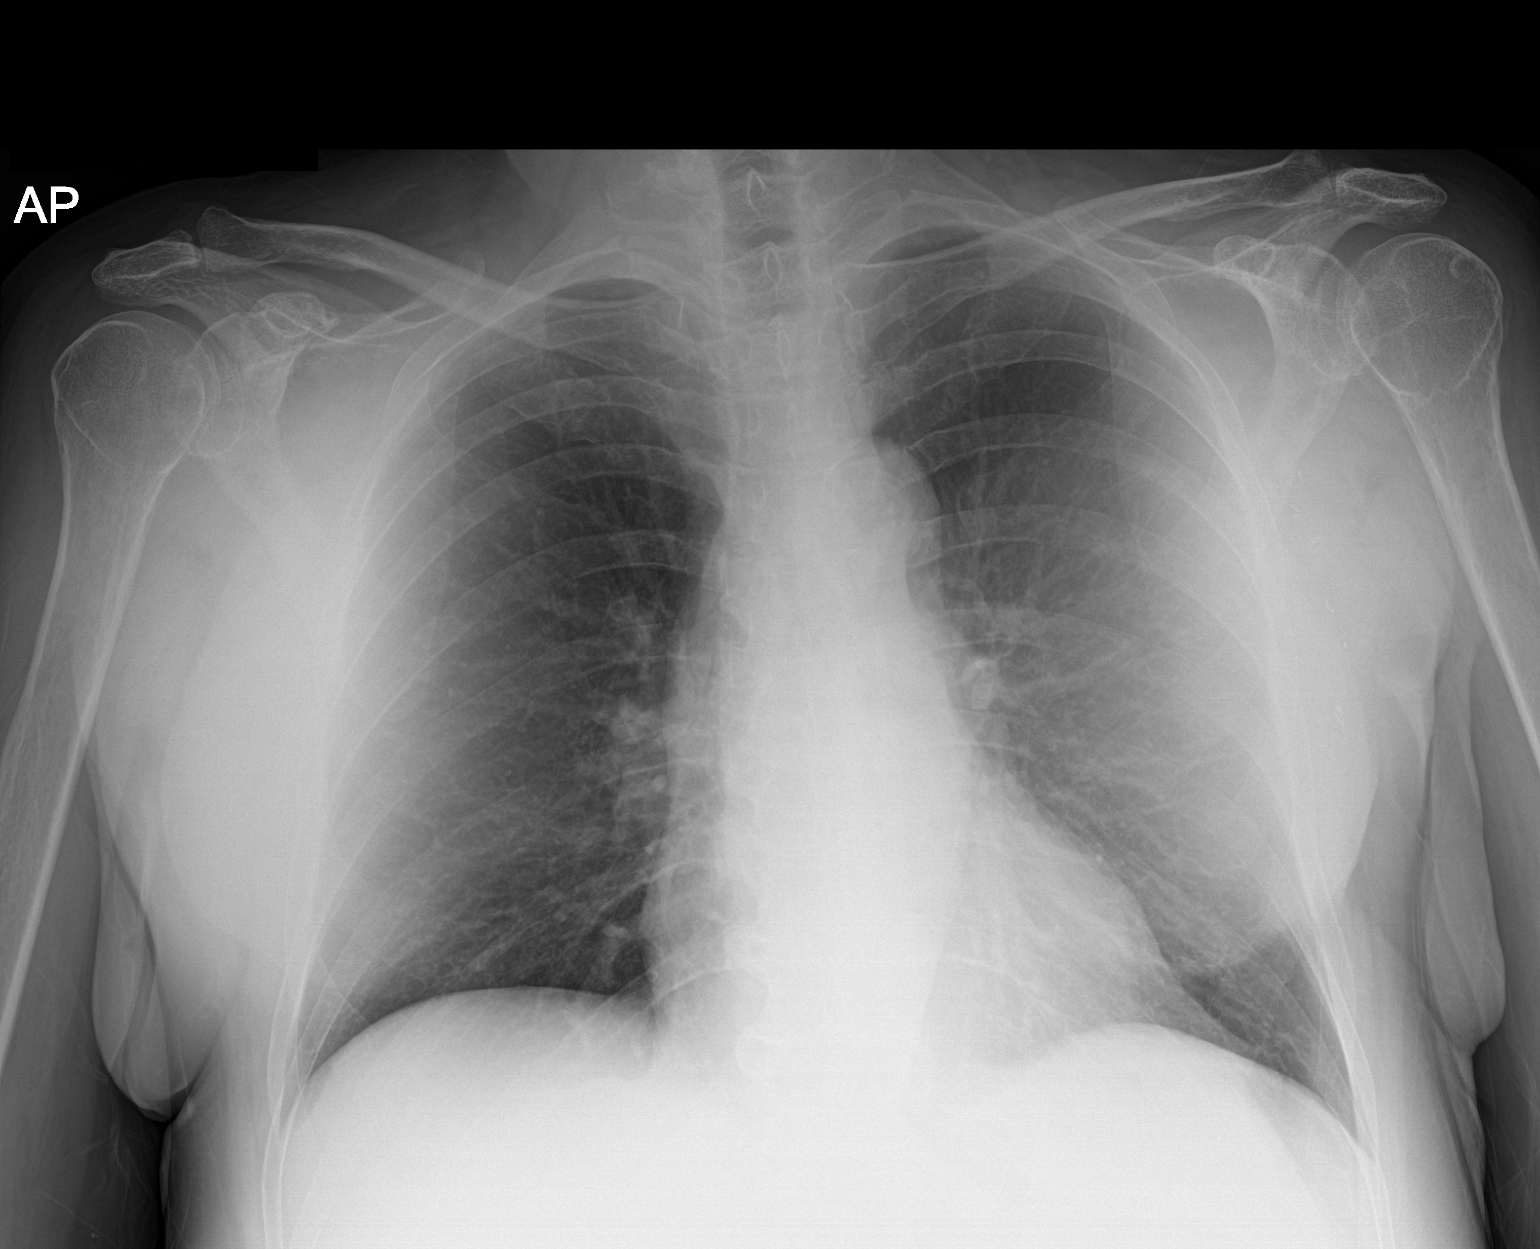

[1 of 1 positions shown; findings below may reference images not displayed]

FINDINGS: Port-A-Cath no longer present. Lungs are clear. Heart size and
pulmonary vascularity are normal. No adenopathy. There is aortic
atherosclerosis. No bone lesions.
IMPRESSION: Lungs clear.  Heart size normal.

Aortic Atherosclerosis ([id number]).

## 2022-04-23 DIAGNOSIS — F3342 Major depressive disorder, recurrent, in full remission: Secondary | ICD-10-CM | POA: Diagnosis present

## 2022-04-29 ENCOUNTER — Other Ambulatory Visit (HOSPITAL_COMMUNITY): Payer: Self-pay | Admitting: *Deleted

## 2022-10-02 ENCOUNTER — Other Ambulatory Visit (HOSPITAL_COMMUNITY): Payer: Self-pay

## 2022-12-29 ENCOUNTER — Other Ambulatory Visit: Payer: Self-pay

## 2022-12-29 ENCOUNTER — Emergency Department (HOSPITAL_COMMUNITY): Payer: Medicare HMO

## 2022-12-29 ENCOUNTER — Encounter (HOSPITAL_COMMUNITY): Payer: Self-pay

## 2022-12-29 ENCOUNTER — Observation Stay (HOSPITAL_COMMUNITY)
Admission: EM | Admit: 2022-12-29 | Discharge: 2022-12-30 | Disposition: A | Discharge status: Home / Self Care | Payer: Medicare HMO | Attending: Family Medicine | Admitting: Family Medicine

## 2022-12-29 DIAGNOSIS — Z853 Personal history of malignant neoplasm of breast: Secondary | ICD-10-CM | POA: Diagnosis not present

## 2022-12-29 DIAGNOSIS — N183 Chronic kidney disease, stage 3 unspecified: Secondary | ICD-10-CM | POA: Insufficient documentation

## 2022-12-29 DIAGNOSIS — J45909 Unspecified asthma, uncomplicated: Secondary | ICD-10-CM | POA: Insufficient documentation

## 2022-12-29 DIAGNOSIS — I6389 Other cerebral infarction: Secondary | ICD-10-CM | POA: Diagnosis not present

## 2022-12-29 DIAGNOSIS — I639 Cerebral infarction, unspecified: Secondary | ICD-10-CM | POA: Diagnosis present

## 2022-12-29 DIAGNOSIS — I129 Hypertensive chronic kidney disease with stage 1 through stage 4 chronic kidney disease, or unspecified chronic kidney disease: Secondary | ICD-10-CM | POA: Insufficient documentation

## 2022-12-29 DIAGNOSIS — R944 Abnormal results of kidney function studies: Secondary | ICD-10-CM | POA: Insufficient documentation

## 2022-12-29 DIAGNOSIS — H532 Diplopia: Secondary | ICD-10-CM | POA: Insufficient documentation

## 2022-12-29 DIAGNOSIS — I6381 Other cerebral infarction due to occlusion or stenosis of small artery: Principal | ICD-10-CM | POA: Insufficient documentation

## 2022-12-29 DIAGNOSIS — Z8673 Personal history of transient ischemic attack (TIA), and cerebral infarction without residual deficits: Secondary | ICD-10-CM | POA: Diagnosis present

## 2022-12-29 DIAGNOSIS — Z79899 Other long term (current) drug therapy: Secondary | ICD-10-CM | POA: Insufficient documentation

## 2022-12-29 DIAGNOSIS — R262 Difficulty in walking, not elsewhere classified: Secondary | ICD-10-CM | POA: Diagnosis not present

## 2022-12-29 DIAGNOSIS — R4781 Slurred speech: Secondary | ICD-10-CM | POA: Insufficient documentation

## 2022-12-29 DIAGNOSIS — IMO0002 Reserved for concepts with insufficient information to code with codable children: Secondary | ICD-10-CM

## 2022-12-29 DIAGNOSIS — I10 Essential (primary) hypertension: Secondary | ICD-10-CM

## 2022-12-29 DIAGNOSIS — R7989 Other specified abnormal findings of blood chemistry: Secondary | ICD-10-CM

## 2022-12-29 DIAGNOSIS — R531 Weakness: Secondary | ICD-10-CM | POA: Diagnosis present

## 2022-12-29 LAB — URINALYSIS, ROUTINE W REFLEX MICROSCOPIC
Bacteria, UA: NONE SEEN
Bilirubin Urine: NEGATIVE
Glucose, UA: NEGATIVE mg/dL
Ketones, ur: NEGATIVE mg/dL
Leukocytes,Ua: NEGATIVE
Nitrite: NEGATIVE
Protein, ur: NEGATIVE mg/dL
Specific Gravity, Urine: 1.019 (ref 1.005–1.030)
pH: 5 (ref 5.0–8.0)

## 2022-12-29 LAB — TSH: TSH: 0.687 u[IU]/mL (ref 0.350–4.500)

## 2022-12-29 LAB — BASIC METABOLIC PANEL
Anion gap: 4 — ABNORMAL LOW (ref 5–15)
BUN: 40 mg/dL — ABNORMAL HIGH (ref 8–23)
CO2: 29 mmol/L (ref 22–32)
Calcium: 8.5 mg/dL — ABNORMAL LOW (ref 8.9–10.3)
Chloride: 106 mmol/L (ref 98–111)
Creatinine, Ser: 1.63 mg/dL — ABNORMAL HIGH (ref 0.44–1.00)
GFR, Estimated: 34 mL/min — ABNORMAL LOW (ref >60)
Glucose, Bld: 130 mg/dL — ABNORMAL HIGH (ref 70–99)
Potassium: 3.9 mmol/L (ref 3.5–5.1)
Sodium: 139 mmol/L (ref 135–145)

## 2022-12-29 LAB — HIV ANTIBODY (ROUTINE TESTING W REFLEX): HIV Screen 4th Generation wRfx: NONREACTIVE

## 2022-12-29 LAB — CBC
HCT: 40 % (ref 36.0–46.0)
Hemoglobin: 12.9 g/dL (ref 12.0–15.0)
MCH: 28.5 pg (ref 26.0–34.0)
MCHC: 32.3 g/dL (ref 30.0–36.0)
MCV: 88.3 fL (ref 80.0–100.0)
Platelets: 178 10*3/uL (ref 150–400)
RBC: 4.53 MIL/uL (ref 3.87–5.11)
RDW: 13.5 % (ref 11.5–15.5)
WBC: 5 10*3/uL (ref 4.0–10.5)
nRBC: 0 % (ref 0.0–0.2)

## 2022-12-29 LAB — LIPID PANEL
Cholesterol: 151 mg/dL (ref 0–200)
HDL: 35 mg/dL — ABNORMAL LOW (ref >40)
LDL Cholesterol: 93 mg/dL (ref 0–99)
Total CHOL/HDL Ratio: 4.3 RATIO
Triglycerides: 117 mg/dL (ref <150)
VLDL: 23 mg/dL (ref 0–40)

## 2022-12-29 LAB — TROPONIN I (HIGH SENSITIVITY)
Troponin I (High Sensitivity): 11 ng/L (ref <18)
Troponin I (High Sensitivity): 11 ng/L (ref <18)

## 2022-12-29 LAB — CBG MONITORING, ED: Glucose-Capillary: 126 mg/dL — ABNORMAL HIGH (ref 70–99)

## 2022-12-29 LAB — HEMOGLOBIN A1C
Hgb A1c MFr Bld: 5.7 % — ABNORMAL HIGH (ref 4.8–5.6)
Mean Plasma Glucose: 116.89 mg/dL

## 2022-12-29 MED ORDER — PROTRIPTYLINE HCL 10 MG PO TABS: 10.0000 mg | ORAL_TABLET | Freq: Every day | ORAL | Status: DC

## 2022-12-29 MED ORDER — PANTOPRAZOLE SODIUM 40 MG PO TBEC
80.0000 mg | DELAYED_RELEASE_TABLET | Freq: Every day | ORAL | Status: DC
  Administered 2022-12-30: 80 mg via ORAL
  Filled 2022-12-29: qty 2

## 2022-12-29 MED ORDER — METHYLPHENIDATE HCL 5 MG PO TABS
10.0000 mg | ORAL_TABLET | Freq: Every day | ORAL | Status: DC
  Administered 2022-12-30: 10 mg via ORAL
  Filled 2022-12-29: qty 2

## 2022-12-29 MED ORDER — ROSUVASTATIN CALCIUM 20 MG PO TABS
20.0000 mg | ORAL_TABLET | Freq: Every day | ORAL | Status: DC
  Administered 2022-12-30: 20 mg via ORAL
  Filled 2022-12-29: qty 1

## 2022-12-29 MED ORDER — PREGABALIN 100 MG PO CAPS
600.0000 mg | ORAL_CAPSULE | Freq: Every day | ORAL | Status: DC
  Administered 2022-12-29: 600 mg via ORAL
  Filled 2022-12-29: qty 6

## 2022-12-29 MED ORDER — CLOPIDOGREL BISULFATE 75 MG PO TABS
300.0000 mg | ORAL_TABLET | Freq: Once | ORAL | Status: AC
  Administered 2022-12-29: 300 mg via ORAL
  Filled 2022-12-29: qty 4

## 2022-12-29 MED ORDER — HYDRALAZINE HCL 25 MG PO TABS
25.0000 mg | ORAL_TABLET | ORAL | Status: AC
  Administered 2022-12-29: 25 mg via ORAL
  Filled 2022-12-29: qty 1

## 2022-12-29 MED ORDER — CLONIDINE HCL 0.1 MG PO TABS
0.2000 mg | ORAL_TABLET | Freq: Two times a day (BID) | ORAL | Status: DC
  Administered 2022-12-29 – 2022-12-30 (×2): 0.2 mg via ORAL
  Filled 2022-12-29 (×2): qty 2

## 2022-12-29 MED ORDER — CLOPIDOGREL BISULFATE 75 MG PO TABS
75.0000 mg | ORAL_TABLET | Freq: Every day | ORAL | Status: DC
  Administered 2022-12-30: 75 mg via ORAL
  Filled 2022-12-29: qty 1

## 2022-12-29 MED ORDER — PAROXETINE HCL 20 MG PO TABS
20.0000 mg | ORAL_TABLET | Freq: Every day | ORAL | Status: DC
  Administered 2022-12-30: 20 mg via ORAL
  Filled 2022-12-29: qty 1

## 2022-12-29 MED ORDER — SIMVASTATIN 20 MG PO TABS: 10.0000 mg | ORAL_TABLET | Freq: Every day | ORAL | Status: DC

## 2022-12-29 MED ORDER — ENOXAPARIN SODIUM 40 MG/0.4ML IJ SOSY
40.0000 mg | PREFILLED_SYRINGE | INTRAMUSCULAR | Status: DC
  Administered 2022-12-29: 40 mg via SUBCUTANEOUS
  Filled 2022-12-29: qty 0.4

## 2022-12-29 MED ORDER — STROKE: EARLY STAGES OF RECOVERY BOOK
Freq: Once | Status: AC
  Filled 2022-12-29: qty 1

## 2022-12-29 MED ORDER — DULOXETINE HCL 60 MG PO CPEP
60.0000 mg | ORAL_CAPSULE | Freq: Two times a day (BID) | ORAL | Status: DC
  Administered 2022-12-29 – 2022-12-30 (×2): 60 mg via ORAL
  Filled 2022-12-29 (×2): qty 1

## 2022-12-29 MED ORDER — GADOBUTROL 1 MMOL/ML IV SOLN
7.5000 mL | Freq: Once | INTRAVENOUS | Status: AC | PRN
  Administered 2022-12-29: 7.5 mL via INTRAVENOUS

## 2022-12-29 MED ORDER — ASPIRIN 81 MG PO TBEC
81.0000 mg | DELAYED_RELEASE_TABLET | Freq: Every day | ORAL | Status: DC
  Administered 2022-12-29 – 2022-12-30 (×2): 81 mg via ORAL
  Filled 2022-12-29 (×2): qty 1

## 2022-12-29 NOTE — Assessment & Plan Note (Addendum)
On exam today patient had no focal neurologic deficits. Alert and oriented x 4. Appears back to baseline. LDL 93. A1c 5.7. 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 B12 164 - neuro following, appreciate recs - frequent neuro checks - fall, delirium precautions - continue aspirin 81 mg daily, clopidogrel 75 mg daily - PT/OT/SLP - start b12 1000 mcg daily - f/u ammonia - f/u carotid US, echo

## 2022-12-29 NOTE — ED Notes (Signed)
Patient transported to CT 

## 2022-12-29 NOTE — Assessment & Plan Note (Addendum)
BP 140s/70s. Goal is normotensive per neuro - continue home clonidine 0.2 mg BID - start amlodipine 2.5 mg daily - monitor BP, keep normotensive

## 2022-12-29 NOTE — Assessment & Plan Note (Addendum)
Cr on admission 1.63. Prior recorded Cr of 1.60 was in 2015. Unsure of baseline. Pt has hx CKD III - avoid nephrotoxic agents - CTM - AM BMP

## 2022-12-29 NOTE — ED Notes (Signed)
Pt currently in MRI

## 2022-12-29 NOTE — Consult Note (Signed)
NEUROLOGY CONSULTATION NOTE   Date of service: December 29, 2022 Patient Name: Cheryl Levy MRN:  [id number] DOB:  1951-01-22 Reason for consult: "weakness, dizziness, and confusion" Requesting physician: "Dr. Sharlett Iles" _ _ _   _ __   _ __ _ _  __ __   _ __   __ _  History of Present Illness   Cheryl Levy is a 72 y.o. female with PMH significant for  has a past medical history of Cancer [facility]), Hypertension, and Thyroid disease. who presents with speech difficulty.   Patient reports coming to the hospital from a clinic appointment to due worsening confusion and speech changes that started last Friday. She is not able to elaborate what she was confused about recently, but daughter describes it as somewhat of a mumbling speech with difficulty word finding. Patient says at baseline, she has some difficulty with speech and attributes this from getting radiation from 2016-2017; however, she feels the symptoms have worsened.. She reports having diplopia when she looks down or up and also gets dizzy. She's also had two headaches last week on the posterior left side, and rates the severity a 7/10. She took ibuprofen when she got the headaches and that alleviated the pain. She reports having sick contacts recently with some of her family members having strep throat. She denies difficulty reading but has some diplopia when she reads for prolonged periods of time. She reports her writing has recently gotten worse, specifically saying the hand movements do not feel as smooth as before. She denies weakness but when she went to the clinic this morning, she was found to have some left sided weakness. Sometimes she also notices difficulty walking and keeping her balance when she walks. She was recently started on Paxil in December for anxiety and she notes benefit from the medication.  Per patient's daughter, she reports the patient noticing increased confusion with word finding and speaking full, complete sentences  starting on 2/16. She describes the speech difficulty as "saying the wrong word or flip the sentences around". The speech persisted so the patient scheduled a clinic appointment today and there, her BP was elevated with SBP in the 200s. She also noticed the patient having a difference in walking, feeling like she leans to the side / generally off balance. She reports the patient having two headaches this week and says prior to those two headaches, the patient does not have a history of headaches. She noticed the patient having slowly declining memory starting 1.5 years ago, forgetting certain tasks or statements. She recently went to a memory clinic and they did not diagnose her with dementia. She denies any recent medication changes other than not being able to take her fibromyalgia medication (Lyrica) for two days since it was not picked up from the pharmacy. Denies seizure, CVA, or LOC history. She reports the patient falling about 8 years ago at night due to being oversedated form a sleeping medication. She says the patient has a history of breast cancer and is now in remission.  Overall she feels like her symptoms have been improving in the last day or so  LKW: 2/15 Thrombolytic given?: No, out of the window IA performed?: No, no LVO based on exam Premorbid modified rankin scale:      1 - No significant disability. Able to carry out all usual activities, despite some symptoms.  (Occasional mild confusion since prior treatment for breast cancer)  ROS   Per HPI: all other systems reviewed and are  negative  Past History   I have reviewed the following:  Past Medical History:  Diagnosis Date   Cancer ([place])    breast cancer   Hypertension    Thyroid disease    Past Surgical History:  Procedure Laterality Date   ABDOMINAL HYSTERECTOMY     BACK SURGERY     JOINT REPLACEMENT     MASTECTOMY Bilateral    THYROIDECTOMY     History reviewed. No pertinent family history. Social History    Socioeconomic History   Marital status: Married    Spouse name: Not on file   Number of children: Not on file   Years of education: Not on file   Highest education level: Not on file  Occupational History   Not on file  Tobacco Use   Smoking status: Never   Smokeless tobacco: Never  Vaping Use   Vaping Use: Never used  Substance and Sexual Activity   Alcohol use: No   Drug use: No   Sexual activity: Not on file  Other Topics Concern   Not on file  Social History Narrative   Not on file   Social Determinants of Health   Financial Resource Strain: Not on file  Food Insecurity: Not on file  Transportation Needs: Not on file  Physical Activity: Not on file  Stress: Not on file  Social Connections: Not on file   Allergies  Allergen Reactions   Ace Inhibitors Other (See Comments)    Chest pain     Medications      No current facility-administered medications for this encounter.  Current Outpatient Medications:    amphetamine-dextroamphetamine (ADDERALL) 10 MG tablet, Take 1 tablet (10 mg total) by mouth daily before breakfast., Disp: 30 tablet, Rfl: 0   amphetamine-dextroamphetamine (ADDERALL) 10 MG tablet, Take 1 tablet (10 mg total) by mouth daily before breakfast., Disp: 30 tablet, Rfl: 0   amphetamine-dextroamphetamine (ADDERALL) 10 MG tablet, Take 1 tablet (10 mg total) by mouth daily before breakfast., Disp: 30 tablet, Rfl: 0   anastrozole (ARIMIDEX) 1 MG tablet, Take 1 mg by mouth daily. , Disp: , Rfl:    beclomethasone (QVAR) 80 MCG/ACT inhaler, Inhale 2 puffs into the lungs 2 (two) times daily. (Patient taking differently: Inhale 2 puffs into the lungs 2 (two) times daily as needed (for breathing).), Disp: 1 Inhaler, Rfl: 0   cloNIDine (CATAPRES) 0.2 MG tablet, Take 0.2 mg by mouth 2 (two) times daily., Disp: , Rfl:    COVID-19 mRNA Vac-TriS, Pfizer, SUSP injection, Inject into the muscle., Disp: 0.3 mL, Rfl: 0   DULoxetine (CYMBALTA) 60 MG capsule, Take 60  mg by mouth., Disp: , Rfl:    eszopiclone (LUNESTA) 1 MG TABS tablet, Take 3 mg by mouth at bedtime as needed for sleep. Take immediately before bedtime, Disp: , Rfl:    Fluticasone-Salmeterol (ADVAIR) 500-50 MCG/DOSE AEPB, Inhale 1 puff into the lungs 2 (two) times daily as needed (for breathing)., Disp: , Rfl:    metaxalone (SKELAXIN) 800 MG tablet, Take 400 mg by mouth 3 (three) times daily., Disp: , Rfl:    methylphenidate (RITALIN) 10 MG tablet, Take 10 mg by mouth daily., Disp: , Rfl:    omeprazole (PRILOSEC) 40 MG capsule, Take 40 mg by mouth 2 (two) times daily. , Disp: , Rfl:    pregabalin (LYRICA) 200 MG capsule, Take 600 mg by mouth at bedtime., Disp: , Rfl:    protriptyline (VIVACTIL) 10 MG tablet, Take 10 mg by mouth., Disp: ,  Rfl:   Vitals   Current vital signs: BP (!) 182/81   Pulse 75   Temp 98.1 F (36.7 C) (Oral)   Resp 16   Ht 5' 5"$  (1.651 m)   Wt 71.9 kg   SpO2 99%   BMI 26.38 kg/m  Vital signs in last 24 hours: Temp:  [97.9 F (36.6 C)-98.4 F (36.9 C)] 98.1 F (36.7 C) (02/20 1424) Pulse Rate:  [74-78] 75 (02/20 1430) Resp:  [16-20] 16 (02/20 1430) BP: (182-201)/(81-127) 182/81 (02/20 1430) SpO2:  [95 %-100 %] 99 % (02/20 1430) Weight:  [71.9 kg] 71.9 kg (02/20 1102)   Body mass index is 26.38 kg/m.  Physical Exam   General: Pleasant, well-appearing  in bed. No acute distress. CV: RRR. No murmurs, rubs, or gallops. No LE edema Pulmonary: Lungs CTAB. Normal effort. No wheezing or rales. Abdominal: Soft, nontender, nondistended. Normal bowel sounds. Skin: Warm and dry. No obvious rash or lesions. Psych: Normal mood and affect  Neuro: *MS: A&O x4. Follows multi-step commands. Able to follow written commands *Speech: fluid, nondysarthric, able to name and repeat. Able to read out loud.  *CN:    I: Deferred   II,III: PERRLA, VFF by confrontation, optic discs unable to be visualized 2/2 pupillary constriction   III,IV,VI: EOMI w/o nystagmus, no  ptosis   V: Sensation intact from V1 to V3 to LT   VII: Eyelid closure was full.  Smile symmetric.   VIII: Hearing intact to voice   IX,X: Voice normal, palate elevates symmetrically    XI: SCM/trap 5/5 bilat   XII: Tongue protrudes midline, no atrophy or fasciculations   *Motor:   Normal bulk.  No tremor, rigidity or bradykinesia. No pronator drift. Writing is intact and legible, some dysgraphia.    Strength: Dlt Bic Tri WrE WrF FgS Gr HF KnF KnE PlF DoF    Left 5 5 5 5 5 5 5 5 5 5 5 5    $ Right 5 5 5 5 5 5 5 5 5 5 5 5    $ *Sensory: Intact to light touch, pinprick, temperature vibration throughout. Symmetric. Propioception intact bilat.  No double-simultaneous extinction.  *Coordination:  Finger-to-nose intact, heel-to-shin with moderate bilateral dysmetria, rapid alternating motions were intact. *Reflexes:  2+ and symmetric throughout without clonus  NIHSS  1a Level of Conscious.: 0 1b LOC Questions: 0 1c LOC Commands: 0 2 Best Gaze: 0 3 Visual: 0 4 Facial Palsy: 0 5a Motor Arm - left: 0 5b Motor Arm - Right: 0 6a Motor Leg - Left: 0 6b Motor Leg - Right: 0 7 Limb Ataxia: 2 -bilateral lower extremities 8 Sensory: 0 9 Best Language: 0 10 Dysarthria: 0 11 Extinct. and Inatten.: 0  TOTAL: 2    Labs   CBC:  Recent Labs  Lab 12/29/22 1105  WBC 5.0  HGB 12.9  HCT 40.0  MCV 88.3  PLT 0000000    Basic Metabolic Panel:  Lab Results  Component Value Date   NA 139 12/29/2022   K 3.9 12/29/2022   CO2 29 12/29/2022   GLUCOSE 130 (H) 12/29/2022   BUN 40 (H) 12/29/2022   CREATININE 1.63 (H) 12/29/2022   CALCIUM 8.5 (L) 12/29/2022   GFRNONAA 34 (L) 12/29/2022   GFRAA 39 (L) 09/27/2014     CT Head without contrast: IMPRESSION: Focal hypodensity along the left lateral aspect of the splenium of the corpus callosum is better assessed on same day brain MRI. No evidence of hemorrhage.  MRI Brain  w/o contrast IMPRESSION: 1. 8 mm T2 FLAIR hyperintense lesion within  the left aspect of the callosal splenium with circumferential restricted diffusion at its periphery. This may reflect a subacute infarct. However, post-contrast MR imaging of the brain is recommended to exclude an intracranial metastasis. 2. 3 mm acute/early subacute lacunar infarct within the right lentiform nucleus. 3. 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  MRI Brain with contrast  IMPRESSION: The lesion in the left aspect of the splenium of the corpus callosum does not demonstrate significant enhancement and is not compatible with a metastasis.  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, - Stroke labs HgbA1c, fasting lipid panel - MRA of the brain without contrast and Carotid duplex - Frequent neuro checks - Echocardiogram - Prophylactic therapy-Antiplatelet med: Aspirin 81 mg daily - Plavix 300 mg load with 75 mg daily for 21 - 90 day course to be determined pending vessel imaging - Risk factor modification - Telemetry monitoring; 30 day event monitor on discharge if no arrythmias captured  - Blood pressure goal   - Normotension to be achieved gradually as she is out of the permissive hypertension window - PT consult, OT consult, Speech consult, as the patient is not fully at baseline - Stroke team to follow  ______________________________________________________________________  Thank you for the opportunity to take part in the care of this patient. If you have any further questions, please contact the neurology consultation attending.  Signed,  Coralyn Pear, MD PGY-1 Psychiatry Resident   Attending Neurologist's  note:  I personally saw this patient, gathering history, performing a full neurologic examination, reviewing relevant labs, personally reviewing relevant imaging including head CT, MRI brain without contrast, MRI brain with contrast, and formulated the assessment and plan, adding the note above for completeness and clarity to accurately reflect my thoughts  Lesleigh Noe MD-PhD [facility] [phone] Available 7 AM to 7 PM, outside these hours please contact Neurologist on call listed on AMION

## 2022-12-29 NOTE — ED Triage Notes (Signed)
Pt arrived POV from home c/o generalized weakness and feeling shaky and some dizziness x3 days.

## 2022-12-29 NOTE — ED Notes (Signed)
Patient transported to MRI 

## 2022-12-29 NOTE — H&P (Addendum)
Hospital Admission History and Physical Service Pager: [phone]  Patient name: Cheryl Levy Medical record number: [id number] Date of Birth: 11/06/1951 Age: 72 y.o. Gender: female  Primary Care Provider: Elisabeth Cara, [place] Consultants: Neuro  Code Status: FULL   Preferred Emergency Contact:  Husband: [phone]  Daughter: Babs Bertin [phone]  Chief Complaint: weakness, dizziness, and confusion   Assessment and Plan: Cheryl Levy is a 72 y.o. female presenting with dizziness, double vision, and confusion. PMHx breast cancer in remission, HTN, thyroid disease. Differential for presentation of this includes stroke vs malignancy.  * Stroke [facility]) CT head shows focal hypodensity along the left lateral aspect of the splenium of the corpus callosum. MRI shows hyperintense lesion within the left aspect of the callosal splenium with circumferential restricted diffusion at its periphery and acute/early subacute lacunar infarct within the right lentiform nucleus. No metastasis. On initial evaluation patient had some mild anomic aphasia, but otherwise normal neuro exam. - admit to [facility] progressive attending Dr. Gwendlyn Deutscher - neuro following, appreciate recs - PT/OT/SLP eval and treat - neuro check q2h - fall, delirium precautions - start aspirin 81 mg daily - start clopidogrel 300 mg loading dose followed by 75 mg daily - f/u lipid, A1c, TSH - f/u MR angio head, carotid US - f/u echo  HTN (hypertension) Systolic in 123456 on admission. Patient reportedly taking antihypertensive regularly. She received hydralazine in ED. Per neuro she is outside window of permissive HTN - continue home clonidine 0.2 mg BID - monitor BP, keep normotensive  Creatinine elevation Cr on admission 1.63. Prior recorded Cr of 1.60 was in 2015. Unsure of baseline. 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 female presenting with dizziness, double vision, and confusion.  She state's that she started feeling "bad" on Saturday, felt worse on Sunday. Patient's daughter says she was confused, unable to get words out that she wants to say, telling stories out of order, was walking funny/drifting to the side, and noticed a change in her hand writing, and difficulty reading. Family encouraged her to see a doctor, and was able to get an appt tuesday. She went to the appointment and they sent her to the ED. Last normal was on Friday.   At primary doctor, her BP was in 200's and she had left sided weakness. She notes she has been mixing up words today.   In the ED, patient received hydralazine for elevated BP in the 123456 systolic. She got a CT Head that showed a focal lesion in they splenium of corpus callosum. They got an MRI Brain WO that confirmed a lesion in the splenium oc corpus callosum, but was also concerning for possible malignancy. They obtained an MRI Brain W and that was non concerning for malignancy.   Review Of Systems: Per HPI  Pertinent Past Medical History: HTN Narcolepsy Fibromyalgia Breast Cancer in remission Asthma CKD stage 3 GERD HLD MDD - remission Migraine  Remainder reviewed in history tab.   Pertinent Past Surgical History: Hyperthyroidism - multinodular goiter- Partial Thyroidectomy - half Back Surgery Double mastectomy and  reconstruction Wrist surgery   Remainder reviewed in history tab.  Pertinent Social History: Tobacco use: Never Alcohol use: Never Other Substance use: No  Lives with Husband, grandson, - girlfriend, great  granddaughter  Pertinent Family History: Heart disease in mom Breast cancer in mom  Remainder reviewed in history tab.   Important Outpatient Medications:  Advair prn Albuterol prn Clonidine 0.2 mg BID Cymbalta BID 60 mg Flonase Ritalin 20 mg TID Protonix 40 mg Paxil 20 mg Lyrica 900 qhs Protriptyline 10 mg qhs Simvastatin 10 mg  Remainder reviewed in medication history.   Objective: BP (!) 117/51 (BP Location: Right Arm)   Pulse 75   Temp 98.2 F (36.8 C)   Resp 16   Ht 5' 5"$  (1.651 m)   Wt 71.9 kg   SpO2 96%   BMI 26.38 kg/m  Exam: General: in no acute distress and well-appearing HEENT: normocephalic and atraumatic Respiratory: non-labored breathing and on RA Extremities: moving all extremities spontaneously Gastrointestinal: non-tender and non-distended Cardiovascular: regular rate  Mental Status: Cheryl Levy is alert; she is oriented to self, place, time, and situation. Speech was clear and fluent  with mild anomic aphasia . She was able to follow 3 step commands without difficulty.  Cranial Nerves: II:  Visual fields grossly normal III,IV, VI: no ptosis, extra-ocular motions intact bilaterally V,VII: smile symmetric, facial light touch sensation intact bilaterally XI: shoulder symmetrically elevate bilaterally XII: midline tongue extension without atrophy and without fasciculations  Motor: Right : Upper extremity   5/5 full power  Lower extremity   5/5 full power  Left: Upper extremity   5/5 full power Lower extremity   5/5 full power  Tone and bulk: normal tone throughout; no atrophy noted  Sensory: sensation to light touch intact throughout bilaterally  Cerebellar: Finger-to-nose test normal, heel-to-shin test normal  Gait: not observed during encounter  Labs:  CBC BMET  Recent Labs  Lab 12/29/22 1105  WBC 5.0  HGB 12.9  HCT 40.0  PLT 178   Recent Labs  Lab 12/29/22 1105  NA 139  K 3.9  CL 106  CO2 29  BUN 40*  CREATININE  1.63*  GLUCOSE 130*  CALCIUM 8.5*     EKG: normal sinus rhythm    Imaging Studies Performed:  CT head w/o contrast (2/20) IMPRESSION: Focal hypodensity along the left lateral aspect of the splenium of the corpus callosum is better assessed on same day brain MRI. No evidence of hemorrhage.  MRI brain w/o contrast (2/20) IMPRESSION: 1. 8 mm T2 FLAIR hyperintense lesion within the left aspect of the callosal splenium with circumferential restricted diffusion at its periphery. This may reflect a subacute infarct. However, post-contrast MR imaging of the brain is recommended to exclude an intracranial metastasis. 2. 3 mm acute/early subacute lacunar infarct within the right lentiform nucleus. 3. 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  MRI brain w contrast (2/20) IMPRESSION: The lesion in the left aspect of the splenium of the corpus callosum does not demonstrate significant enhancement and is not compatible with a metastasis.  Camelia Phenes, MD 12/29/2022, 6:57 PM PGY-1, [place] Intern pager: [phone], text pages welcome Secure chat group [place]

## 2022-12-29 NOTE — ED Provider Notes (Signed)
[facility] Provider Note   CSN: [id number] Arrival date & time: 12/29/22  1051     History  Chief Complaint  Patient presents with   Weakness    Cheryl Levy is a 72 y.o. female.  72 year old female who presents emergency department with dizziness, double vision, and confusion.  Patient reports that on Saturday she started experiencing dizziness.  Says that when she tries to walk it feels like she is drunk.  Has a sensation of leaning to her head when walking.  Says it is also worse when looking up or down with her eyes.  Feels as though the room is spinning but if she closes her eyes it stops.  Also noticed diplopia while trying to read.  No difficulty swallowing.  Occasionally difficulty speaking and daughter noticed that she is confused as well.  Does have a mild headache.  No head trauma.  No focal weakness of her upper or lower extremities.  Not drink alcohol.  No history of eating disorders or special diets.       Home Medications Prior to Admission medications   Medication Sig Start Date End Date Taking? Authorizing Provider  amphetamine-dextroamphetamine (ADDERALL) 10 MG tablet Take 1 tablet (10 mg total) by mouth daily before breakfast. 07/29/17 08/28/17  Dohmeier, Asencion Partridge, MD  amphetamine-dextroamphetamine (ADDERALL) 10 MG tablet Take 1 tablet (10 mg total) by mouth daily before breakfast. 09/29/17 10/29/17  Dohmeier, Asencion Partridge, MD  amphetamine-dextroamphetamine (ADDERALL) 10 MG tablet Take 1 tablet (10 mg total) by mouth daily before breakfast. 08/29/17 09/28/17  Dohmeier, Asencion Partridge, MD  anastrozole (ARIMIDEX) 1 MG tablet Take 1 mg by mouth daily.  09/03/14   [provider]  beclomethasone (QVAR) 80 MCG/ACT inhaler Inhale 2 puffs into the lungs 2 (two) times daily. Patient taking differently: Inhale 2 puffs into the lungs 2 (two) times daily as needed (for breathing). 05/23/14   Davonna Belling, MD  cloNIDine (CATAPRES) 0.2  MG tablet Take 0.2 mg by mouth 2 (two) times daily.    [provider]  COVID-19 mRNA Vac-TriS, Pfizer, SUSP injection Inject into the muscle. 04/21/21   Carlyle Basques, MD  DULoxetine (CYMBALTA) 60 MG capsule Take 60 mg by mouth. 02/03/17   [provider]  eszopiclone (LUNESTA) 1 MG TABS tablet Take 3 mg by mouth at bedtime as needed for sleep. Take immediately before bedtime    [provider]  Fluticasone-Salmeterol (ADVAIR) 500-50 MCG/DOSE AEPB Inhale 1 puff into the lungs 2 (two) times daily as needed (for breathing).    [provider]  metaxalone (SKELAXIN) 800 MG tablet Take 400 mg by mouth 3 (three) times daily.    [provider]  methylphenidate (RITALIN) 10 MG tablet Take 10 mg by mouth daily.    [provider]  omeprazole (PRILOSEC) 40 MG capsule Take 40 mg by mouth 2 (two) times daily.     [provider]  pregabalin (LYRICA) 200 MG capsule Take 600 mg by mouth at bedtime.    [provider]  protriptyline (VIVACTIL) 10 MG tablet Take 10 mg by mouth. 05/19/17   [provider]      Allergies    Ace inhibitors    Review of Systems   Review of Systems  Physical Exam Updated Vital Signs BP (!) 182/81   Pulse 75   Temp 98.1 F (36.7 C) (Oral)   Resp 16   Ht 5' 5"$  (1.651 m)   Wt 71.9 kg  SpO2 99%   BMI 26.38 kg/m  Physical Exam Vitals and nursing note reviewed.  Constitutional:      General: She is not in acute distress.    Appearance: She is well-developed.  HENT:     Head: Normocephalic and atraumatic.     Right Ear: External ear normal.     Left Ear: External ear normal.     Nose: Nose normal.  Eyes:     Extraocular Movements: Extraocular movements intact.     Conjunctiva/sclera: Conjunctivae normal.     Pupils: Pupils are equal, round, and reactive to light.  Cardiovascular:     Rate and Rhythm: Normal rate and regular rhythm.     Heart sounds: No murmur heard. Pulmonary:      Effort: Pulmonary effort is normal. No respiratory distress.     Breath sounds: Normal breath sounds.  Abdominal:     General: Abdomen is flat.     Palpations: Abdomen is soft.  Musculoskeletal:     Cervical back: Normal range of motion and neck supple.     Right lower leg: No edema.     Left lower leg: No edema.  Skin:    General: Skin is warm and dry.  Neurological:     Mental Status: She is alert and oriented to person, place, and time. Mental status is at baseline.     Comments: MENTAL STATUS: AAOx3 CRANIAL NERVES: II: Pupils equal and reactive 3 mm BL, no RAPD, no VF deficits III, IV, VI: EOM intact, no gaze preference or deviation, no nystagmus. V: normal sensation to light touch in V1, V2, and V3 segments bilaterally VII: no facial weakness or asymmetry, no nasolabial fold flattening VIII: normal hearing to speech and finger friction IX, X: normal palatal elevation, no uvular deviation XI: 5/5 head turn and 5/5 shoulder shrug bilaterally XII: midline tongue protrusion MOTOR: 5/5 strength in R shoulder flexion, elbow flexion and extension, and grip strength. 5/5 strength in L shoulder flexion, elbow flexion and extension, and grip strength.  5/5 strength in R hip and knee flexion, knee extension, ankle plantar and dorsiflexion. 5/5 strength in L hip and knee flexion, knee extension, ankle plantar and dorsiflexion. SENSORY: Normal sensation to light touch in all extremities COORD: Normal finger to nose and heel to shin, no tremor, no dysmetria STATION: normal stance, no truncal ataxia GAIT: Normal   Psychiatric:        Mood and Affect: Mood normal.     ED Results / Procedures / Treatments   Labs (all labs ordered are listed, but only abnormal results are displayed) Labs Reviewed  BASIC METABOLIC PANEL - Abnormal; Notable for the following components:      Result Value   Glucose, Bld 130 (*)    BUN 40 (*)    Creatinine, Ser 1.63 (*)    Calcium 8.5 (*)    GFR,  Estimated 34 (*)    Anion gap 4 (*)    All other components within normal limits  URINALYSIS, ROUTINE W REFLEX MICROSCOPIC - Abnormal; Notable for the following components:   APPearance HAZY (*)    Hgb urine dipstick SMALL (*)    All other components within normal limits  CBG MONITORING, ED - Abnormal; Notable for the following components:   Glucose-Capillary 126 (*)    All other components within normal limits  CBC  LIPID PANEL  HEMOGLOBIN A1C  TSH  TROPONIN I (HIGH SENSITIVITY)  TROPONIN I (HIGH SENSITIVITY)    EKG EKG Interpretation  Date/Time:  Tuesday December 29 2022 10:40:21 EST Ventricular Rate:  76 PR Interval:  144 QRS Duration: 92 QT Interval:  372 QTC Calculation: 418 R Axis:   -4 Text Interpretation: Normal sinus rhythm Left ventricular hypertrophy with repolarization abnormality ( Cornell product ) Abnormal ECG When compared with ECG of 27-Sep-2014 23:30, PREVIOUS ECG IS PRESENT Confirmed by Margaretmary Eddy [id number]) on 12/29/2022 12:30:00 PM  Radiology MR BRAIN W CONTRAST  Result Date: 12/29/2022 CLINICAL DATA:  double vision, diffiuculty walking EXAM: MRI HEAD WITH CONTRAST TECHNIQUE: Multiplanar, multiecho pulse sequences of the brain and surrounding structures were obtained with intravenous contrast. CONTRAST:  7.14m GADAVIST GADOBUTROL 1 MMOL/ML IV SOLN COMPARISON:  February 20, 24. FINDINGS: Postcontrast imaging was obtained to further evaluate the abnormality seen on same day MRI head. The lesion in the left aspect of the splenium of the corpus callosum does not demonstrate significant enhancement. No pathologic enhancement elsewhere. IMPRESSION: The lesion in the left aspect of the splenium of the corpus callosum does not demonstrate significant enhancement and is not compatible with a metastasis. Electronically Signed   By: FMargaretha SheffieldM.D.   On: 12/29/2022 15:52   CT Head Wo Contrast  Result Date: 12/29/2022 CLINICAL DATA:  Ataxia.  Headache. EXAM: CT  HEAD WITHOUT CONTRAST TECHNIQUE: Contiguous axial images were obtained from the base of the skull through the vertex without intravenous contrast. RADIATION DOSE REDUCTION: This exam was performed according to the departmental dose-optimization program which includes automated exposure control, adjustment of the mA and/or kV according to patient size and/or use of iterative reconstruction technique. COMPARISON:  Same day brain MRI FINDINGS: Brain: Focal hypodensity along the lateral aspect of the splenium of the corpus callosum on the left is better assessed on same day brain MRI. There is no evidence of hemorrhage. No extra-axial fluid collection. No hydrocephalus. There is sequela of moderate chronic microvascular ischemic change, which is better seen on same day brain MRI. Vascular: No hyperdense vessel or unexpected calcification. Skull: The floor of the sella is in, but not definitively dehiscent. Sinuses/Orbits: No acute finding. Other: None. IMPRESSION: Focal hypodensity along the left lateral aspect of the splenium of the corpus callosum is better assessed on same day brain MRI. No evidence of hemorrhage. Electronically Signed   By: HMarin RobertsM.D.   On: 12/29/2022 14:30   MR BRAIN WO CONTRAST  Result Date: 12/29/2022 CLINICAL DATA:  Provided history: Ataxia, double vision. Generalized weakness, dizziness. EXAM: MRI HEAD WITHOUT CONTRAST TECHNIQUE: Multiplanar, multiecho pulse sequences of the brain and surrounding structures were obtained without intravenous contrast. COMPARISON:  No pertinent prior exams available for comparison. FINDINGS: The coronal T2-weighted sequence is moderately motion degraded. Brain: No age advanced or lobar predominant parenchymal atrophy. 8 mm T2 FLAIR hyperintense lesion within the left aspect of the callosal splenium with circumferential restricted diffusion at its periphery (series 2, image 30) (series 6, image 21). 3 mm focus of restricted diffusion within the right  lentiform nucleus, compatible with an acute/subacute lacunar infarct. T2 hyperintense foci within the bilateral corona radiata/basal ganglia, likely reflecting a combination of chronic lacunar infarcts and prominent perivascular spaces. 9 mm chronic infarct right aspect of the callosal genu. Moderate multifocal T2 FLAIR hyperintense signal abnormality elsewhere within the cerebral white matter, nonspecific but compatible with chronic small vessel ischemic disease. Chronic lacunar infarct within the right thalamus. Chronic lacunar infarct within the left aspect of the pons. Background moderate pontine chronic small vessel ischemic disease. Small chronic infarct within the left  cerebellar hemisphere. No chronic intracranial blood products. No extra-axial fluid collection. No midline shift. Vascular: Maintained flow voids within the proximal large arterial vessels. Skull and upper cervical spine: No focal suspicious marrow lesion. Sinuses/Orbits: No mass or acute finding within the imaged orbits. No significant paranasal sinus disease. IMPRESSION: 1. 8 mm T2 FLAIR hyperintense lesion within the left aspect of the callosal splenium with circumferential restricted diffusion at its periphery. This may reflect a subacute infarct. However, post-contrast MR imaging of the brain is recommended to exclude an intracranial metastasis. 2. 3 mm acute/early subacute lacunar infarct within the right lentiform nucleus. 3. 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 Electronically Signed   By: Kellie Simmering D.O.   On: 12/29/2022 14:26    Procedures Procedures   Medications Ordered in ED Medications   stroke: early stages of recovery book (has no administration in time range)  hydrALAZINE (APRESOLINE) tablet 25 mg (25 mg Oral Given 12/29/22 1424)  gadobutrol (GADAVIST) 1 MMOL/ML injection 7.5 mL (7.5 mLs Intravenous Contrast Given 12/29/22 1540)    ED Course/ Medical  Decision Making/ A&P Clinical Course as of 12/29/22 1635  Tue Dec 29, 2022  1229 Creatinine(!): 1.63 At baseline [RP]  16 Dr Curly Shores from neurology has reviewed MRI and is concerned about corpus callosum lesion being possible stroke or malignancy. Recommends MRI brain with contrast.  [RP]  [id number] Dr Edd Arbour from family medicine will admit.  Dr McDermitt will be attending physician. [RP]    Clinical Course User Index [RP] Fransico Meadow, MD                             Medical Decision Making Amount and/or Complexity of Data Reviewed Labs: ordered. Decision-making details documented in ED Course. Radiology: ordered.  Risk Prescription drug management. Decision regarding hospitalization.   Harshini Minarik is a 72 y.o. female with comorbidities that complicate the patient evaluation including dizziness, double vision, and confusion  Initial Ddx:  Stroke, infection, MI, dehydration, peripheral vertigo, vitamin deficiency, Warnicke's encephalopathy hypertensive encephalopathy  MDM:  Concern the patient may have had a stroke given her symptoms though does not have clear central vertigo.  May potentially have vitamin deficiency but does not have any clear risk factors for this.  Nonalcoholic and suspect Warnicke's encephalopathy less likely.  With her confusion feel that peripheral vertigo less likely.  Will obtain troponin to assess for MI.  No clear infectious symptoms.   Plan:  CT head MRI Urinalysis Troponin EKG Hydralazine  ED Summary/Re-evaluation:  Patient underwent the above workup and showed that patient may have had a stroke on her noncontrast MRI.  Noncontrast head CT without acute abnormality aside from what was seen on the MRI.  Discussed with neurology who recommended MRI brain with contrast to rule out possible brain metastases.  Patient was then admitted to medicine for further management.  This patient presents to the ED for concern of complaints listed in HPI, this  involves an extensive number of treatment options, and is a complaint that carries with it a high risk of complications and morbidity. Disposition including potential need for admission considered.   Dispo: Admit to Floor  Additional history obtained from daughter Records reviewed Outpatient Clinic Notes The following labs were independently interpreted: Chemistry and show CKD I independently reviewed the following imaging with scope of interpretation limited to determining acute life threatening conditions related to emergency care: CT Head  and agree with the radiologist interpretation with the following exceptions: none I personally reviewed and interpreted cardiac monitoring: normal sinus rhythm  I personally reviewed and interpreted the pt's EKG: see above for interpretation  I have reviewed the patients home medications and made adjustments as needed Consults: Hospitalist and Neurology Social Determinants of health:  Elderly  Final Clinical Impression(s) / ED Diagnoses Final diagnoses:  Cerebrovascular accident (CVA), unspecified mechanism ([place])  Difficulty walking  History of breast cancer    Rx / DC Orders ED Discharge Orders     None         Fransico Meadow, MD 12/29/22 1635

## 2022-12-29 NOTE — ED Notes (Signed)
ED TO INPATIENT HANDOFF REPORT  ED Nurse Name and Phone #: Tori [phone]  S Name/Age/Gender Cheryl Levy 72 y.o. female Room/Bed: 016C/016C  Code Status   Code Status: Not on file  Home/SNF/Other Home Patient oriented to: self, place, time, and situation Is this baseline? Yes   Triage Complete: Triage complete  Chief Complaint Stroke [facility]) [I63.9]  Triage Note Pt arrived POV from home c/o generalized weakness and feeling shaky and some dizziness x3 days.    Allergies Allergies  Allergen Reactions   Ace Inhibitors Other (See Comments)    Chest pain     Level of Care/Admitting Diagnosis ED Disposition     ED Disposition  Admit   Condition  --   Comment  Hospital Area: [facility] [[id number]]  Level of Care: Telemetry Medical [104]  May place patient in observation at [facility] or [place] if equivalent level of care is available:: No  Covid Evaluation: Asymptomatic - no recent exposure (last 10 days) testing not required  Diagnosis: Stroke [facility]) [id number]  Admitting Physician: Camelia Phenes [phone]  Attending Physician: MCDIARMID, TODD D [[id number]]          B Medical/Surgery History Past Medical History:  Diagnosis Date   Cancer ([place])    breast cancer   Hypertension    Thyroid disease    Past Surgical History:  Procedure Laterality Date   ABDOMINAL HYSTERECTOMY     BACK SURGERY     JOINT REPLACEMENT     MASTECTOMY Bilateral    THYROIDECTOMY       A IV Location/Drains/Wounds Patient Lines/Drains/Airways Status     Active Line/Drains/Airways     None            Intake/Output Last 24 hours No intake or output data in the 24 hours ending 12/29/22 1523  Labs/Imaging Results for orders placed or performed during the hospital encounter of 12/29/22 (from the past 48 hour(s))  Basic metabolic panel     Status: Abnormal   Collection Time: 12/29/22 11:05 AM  Result Value Ref Range   Sodium 139 135 - 145 mmol/L   Potassium 3.9  3.5 - 5.1 mmol/L   Chloride 106 98 - 111 mmol/L   CO2 29 22 - 32 mmol/L   Glucose, Bld 130 (H) 70 - 99 mg/dL    Comment: Glucose reference range applies only to samples taken after fasting for at least 8 hours.   BUN 40 (H) 8 - 23 mg/dL   Creatinine, Ser 1.63 (H) 0.44 - 1.00 mg/dL   Calcium 8.5 (L) 8.9 - 10.3 mg/dL   GFR, Estimated 34 (L) >60 mL/min    Comment: (NOTE) Calculated using the CKD-EPI Creatinine Equation (2021)    Anion gap 4 (L) 5 - 15    Comment: Performed at [facility] [address] [place] [address]  CBC     Status: None   Collection Time: 12/29/22 11:05 AM  Result Value Ref Range   WBC 5.0 4.0 - 10.5 K/uL   RBC 4.53 3.87 - 5.11 MIL/uL   Hemoglobin 12.9 12.0 - 15.0 g/dL   HCT 40.0 36.0 - 46.0 %   MCV 88.3 80.0 - 100.0 fL   MCH 28.5 26.0 - 34.0 pg   MCHC 32.3 30.0 - 36.0 g/dL   RDW 13.5 11.5 - 15.5 %   Platelets 178 150 - 400 K/uL   nRBC 0.0 0.0 - 0.2 %    Comment: Performed at [facility], [place] [address], [place], [place] [address]  Urinalysis, Routine w reflex microscopic -Urine, Clean Catch     Status: Abnormal   Collection Time: 12/29/22 12:10 PM  Result Value Ref Range   Color, Urine YELLOW YELLOW   APPearance HAZY (A) CLEAR   Specific Gravity, Urine 1.019 1.005 - 1.030   pH 5.0 5.0 - 8.0   Glucose, UA NEGATIVE NEGATIVE mg/dL   Hgb urine dipstick SMALL (A) NEGATIVE   Bilirubin Urine NEGATIVE NEGATIVE   Ketones, ur NEGATIVE NEGATIVE mg/dL   Protein, ur NEGATIVE NEGATIVE mg/dL   Nitrite NEGATIVE NEGATIVE   Leukocytes,Ua NEGATIVE NEGATIVE   RBC / HPF 0-5 0 - 5 RBC/hpf   WBC, UA 0-5 0 - 5 WBC/hpf   Bacteria, UA NONE SEEN NONE SEEN   Squamous Epithelial / HPF 0-5 0 - 5 /HPF   Mucus PRESENT    Hyaline Casts, UA PRESENT     Comment: Performed at [facility] [address] [place], [place] [address]  CBG monitoring, ED     Status: Abnormal   Collection Time: 12/29/22 12:13 PM  Result Value Ref Range    Glucose-Capillary 126 (H) 70 - 99 mg/dL    Comment: Glucose reference range applies only to samples taken after fasting for at least 8 hours.   CT Head Wo Contrast  Result Date: 12/29/2022 CLINICAL DATA:  Ataxia.  Headache. EXAM: CT HEAD WITHOUT CONTRAST TECHNIQUE: Contiguous axial images were obtained from the base of the skull through the vertex without intravenous contrast. RADIATION DOSE REDUCTION: This exam was performed according to the departmental dose-optimization program which includes automated exposure control, adjustment of the mA and/or kV according to patient size and/or use of iterative reconstruction technique. COMPARISON:  Same day brain MRI FINDINGS: Brain: Focal hypodensity along the lateral aspect of the splenium of the corpus callosum on the left is better assessed on same day brain MRI. There is no evidence of hemorrhage. No extra-axial fluid collection. No hydrocephalus. There is sequela of moderate chronic microvascular ischemic change, which is better seen on same day brain MRI. Vascular: No hyperdense vessel or unexpected calcification. Skull: The floor of the sella is in, but not definitively dehiscent. Sinuses/Orbits: No acute finding. Other: None. IMPRESSION: Focal hypodensity along the left lateral aspect of the splenium of the corpus callosum is better assessed on same day brain MRI. No evidence of hemorrhage. Electronically Signed   By: Marin Roberts M.D.   On: 12/29/2022 14:30   MR BRAIN WO CONTRAST  Result Date: 12/29/2022 CLINICAL DATA:  Provided history: Ataxia, double vision. Generalized weakness, dizziness. EXAM: MRI HEAD WITHOUT CONTRAST TECHNIQUE: Multiplanar, multiecho pulse sequences of the brain and surrounding structures were obtained without intravenous contrast. COMPARISON:  No pertinent prior exams available for comparison. FINDINGS: The coronal T2-weighted sequence is moderately motion degraded. Brain: No age advanced or lobar predominant parenchymal atrophy.  8 mm T2 FLAIR hyperintense lesion within the left aspect of the callosal splenium with circumferential restricted diffusion at its periphery (series 2, image 30) (series 6, image 21). 3 mm focus of restricted diffusion within the right lentiform nucleus, compatible with an acute/subacute lacunar infarct. T2 hyperintense foci within the bilateral corona radiata/basal ganglia, likely reflecting a combination of chronic lacunar infarcts and prominent perivascular spaces. 9 mm chronic infarct right aspect of the callosal genu. Moderate multifocal T2 FLAIR hyperintense signal abnormality elsewhere within the cerebral white matter, nonspecific but compatible with chronic small vessel ischemic disease. Chronic lacunar infarct  within the right thalamus. Chronic lacunar infarct within the left aspect of the pons. Background moderate pontine chronic small vessel ischemic disease. Small chronic infarct within the left cerebellar hemisphere. No chronic intracranial blood products. No extra-axial fluid collection. No midline shift. Vascular: Maintained flow voids within the proximal large arterial vessels. Skull and upper cervical spine: No focal suspicious marrow lesion. Sinuses/Orbits: No mass or acute finding within the imaged orbits. No significant paranasal sinus disease. IMPRESSION: 1. 8 mm T2 FLAIR hyperintense lesion within the left aspect of the callosal splenium with circumferential restricted diffusion at its periphery. This may reflect a subacute infarct. However, post-contrast MR imaging of the brain is recommended to exclude an intracranial metastasis. 2. 3 mm acute/early subacute lacunar infarct within the right lentiform nucleus. 3. 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 Electronically Signed   By: Kellie Simmering D.O.   On: 12/29/2022 14:26    Pending Labs Unresulted Labs (From admission, onward)     Start     Ordered   12/29/22 1511  Lipid  panel  Add-on,   AD        12/29/22 1511   12/29/22 1511  Hemoglobin A1c  Add-on,   AD        12/29/22 1511   12/29/22 1511  TSH  Add-on,   AD        12/29/22 1511            Vitals/Pain Today's Vitals   12/29/22 1315 12/29/22 1424 12/29/22 1424 12/29/22 1430  BP: (!) 190/84 (!) 201/89  (!) 182/81  Pulse: 74   75  Resp: 20   16  Temp:   98.1 F (36.7 C)   TempSrc:   Oral   SpO2: 99%   99%  Weight:      Height:      PainSc:        Isolation Precautions No active isolations  Medications Medications  hydrALAZINE (APRESOLINE) tablet 25 mg (25 mg Oral Given 12/29/22 1424)    Mobility walks     Focused Assessments Neuro Assessment Handoff:  Swallow screen pass? Yes          Neuro Assessment:   Neuro Checks:      Has TPA been given? No If patient is a Neuro Trauma and patient is going to OR before floor call report to [place] nurse: [phone] or [phone]   R Recommendations: See Admitting Provider Note  Report given to:   Additional Notes: Pt has old stroke with no deficits

## 2022-12-30 ENCOUNTER — Observation Stay (HOSPITAL_COMMUNITY): Payer: Medicare HMO

## 2022-12-30 ENCOUNTER — Observation Stay (HOSPITAL_BASED_OUTPATIENT_CLINIC_OR_DEPARTMENT_OTHER): Payer: Medicare HMO

## 2022-12-30 ENCOUNTER — Other Ambulatory Visit (HOSPITAL_COMMUNITY): Payer: Self-pay

## 2022-12-30 DIAGNOSIS — I6389 Other cerebral infarction: Secondary | ICD-10-CM | POA: Diagnosis not present

## 2022-12-30 DIAGNOSIS — I63332 Cerebral infarction due to thrombosis of left posterior cerebral artery: Secondary | ICD-10-CM

## 2022-12-30 DIAGNOSIS — I10 Essential (primary) hypertension: Secondary | ICD-10-CM | POA: Diagnosis not present

## 2022-12-30 DIAGNOSIS — I639 Cerebral infarction, unspecified: Secondary | ICD-10-CM | POA: Diagnosis not present

## 2022-12-30 LAB — ECHOCARDIOGRAM COMPLETE
AR max vel: 2.25 cm2
AV Area VTI: 2.17 cm2
AV Area mean vel: 2.12 cm2
AV Mean grad: 3.5 mmHg
AV Peak grad: 6.1 mmHg
Ao pk vel: 1.24 m/s
Area-P 1/2: 2.95 cm2
Calc EF: 54.1 %
Height: 65 in
MV M vel: 2.99 m/s
MV Peak grad: 35.8 mmHg
P 1/2 time: 504 msec
S' Lateral: 3 cm
Single Plane A2C EF: 49.2 %
Single Plane A4C EF: 55.1 %
Weight: 2536 oz

## 2022-12-30 LAB — BASIC METABOLIC PANEL
Anion gap: 9 (ref 5–15)
BUN: 29 mg/dL — ABNORMAL HIGH (ref 8–23)
CO2: 26 mmol/L (ref 22–32)
Calcium: 8.5 mg/dL — ABNORMAL LOW (ref 8.9–10.3)
Chloride: 103 mmol/L (ref 98–111)
Creatinine, Ser: 1.48 mg/dL — ABNORMAL HIGH (ref 0.44–1.00)
GFR, Estimated: 38 mL/min — ABNORMAL LOW (ref >60)
Glucose, Bld: 108 mg/dL — ABNORMAL HIGH (ref 70–99)
Potassium: 3.6 mmol/L (ref 3.5–5.1)
Sodium: 138 mmol/L (ref 135–145)

## 2022-12-30 LAB — CBC
HCT: 41.4 % (ref 36.0–46.0)
Hemoglobin: 13.6 g/dL (ref 12.0–15.0)
MCH: 28.3 pg (ref 26.0–34.0)
MCHC: 32.9 g/dL (ref 30.0–36.0)
MCV: 86.3 fL (ref 80.0–100.0)
Platelets: 175 10*3/uL (ref 150–400)
RBC: 4.8 MIL/uL (ref 3.87–5.11)
RDW: 13.1 % (ref 11.5–15.5)
WBC: 4.5 10*3/uL (ref 4.0–10.5)
nRBC: 0 % (ref 0.0–0.2)

## 2022-12-30 LAB — VITAMIN B12: Vitamin B-12: 164 pg/mL — ABNORMAL LOW (ref 180–914)

## 2022-12-30 LAB — AMMONIA: Ammonia: 38 umol/L — ABNORMAL HIGH (ref 9–35)

## 2022-12-30 MED ORDER — ACETAMINOPHEN 325 MG PO TABS
650.0000 mg | ORAL_TABLET | Freq: Four times a day (QID) | ORAL | Status: DC | PRN
  Administered 2022-12-30: 650 mg via ORAL
  Filled 2022-12-30: qty 2

## 2022-12-30 MED ORDER — AMLODIPINE BESYLATE 2.5 MG PO TABS
2.5000 mg | ORAL_TABLET | Freq: Every day | ORAL | Status: DC
  Administered 2022-12-30: 2.5 mg via ORAL
  Filled 2022-12-30: qty 1

## 2022-12-30 MED ORDER — ROSUVASTATIN CALCIUM 20 MG PO TABS
20.0000 mg | ORAL_TABLET | Freq: Every day | ORAL | 0 refills | Status: AC
  Filled 2022-12-30: qty 30, 30d supply, fill #0

## 2022-12-30 MED ORDER — ASPIRIN 81 MG PO TBEC
81.0000 mg | DELAYED_RELEASE_TABLET | Freq: Every day | ORAL | 0 refills | Status: DC
  Filled 2022-12-30: qty 30, 30d supply, fill #0

## 2022-12-30 MED ORDER — PAROXETINE HCL 20 MG PO TABS
20.0000 mg | ORAL_TABLET | Freq: Every day | ORAL | 0 refills | Status: AC
  Filled 2022-12-30: qty 30, 30d supply, fill #0

## 2022-12-30 MED ORDER — VITAMIN B-12 1000 MCG PO TABS
1000.0000 ug | ORAL_TABLET | Freq: Every day | ORAL | Status: DC
  Administered 2022-12-30: 1000 ug via ORAL
  Filled 2022-12-30: qty 1

## 2022-12-30 MED ORDER — CLOPIDOGREL BISULFATE 75 MG PO TABS
75.0000 mg | ORAL_TABLET | Freq: Every day | ORAL | 0 refills | Status: AC
  Filled 2022-12-30: qty 20, 20d supply, fill #0

## 2022-12-30 MED ORDER — AMLODIPINE BESYLATE 2.5 MG PO TABS
2.5000 mg | ORAL_TABLET | Freq: Every day | ORAL | 0 refills | Status: DC
  Filled 2022-12-30: qty 30, 30d supply, fill #0

## 2022-12-30 MED ORDER — CYANOCOBALAMIN 1000 MCG PO TABS
1000.0000 ug | ORAL_TABLET | Freq: Every day | ORAL | 0 refills | Status: AC
  Filled 2022-12-30: qty 30, 30d supply, fill #0

## 2022-12-30 NOTE — Evaluation (Signed)
Occupational Therapy Evaluation Patient Details Name: Cheryl Levy MRN: [id number] DOB: 02/03/1951 Today's Date: 12/30/2022   History of Present Illness Pt is a 72 y.o. female who presented 12/29/22 with confusion, dizziness, double vision, and L-sided weakness. MRI revealed lesion within the left aspect of the callosal splenium, which may reflect a subacute infarct, along with an acute/early subacute lacunar infarct within the right lentiform nucleus. PMH: cancer, HTN, thyroid disease   Clinical Impression   Prior to this admission, patient living with her husband (patient is primary caretaker of husband) and her grandson, his girlfriend, and their child. Patient reports full independence and still driving. Currently, patient is back to baseline for ADLs, vision, and upper level cognition, with no acute needs noted. Educated on BE FAST acronym and deferring to Neurology if patient can drive at discharge. OT signing off at this time.      Recommendations for follow up therapy are one component of a multi-disciplinary discharge planning process, led by the attending physician.  Recommendations may be updated based on patient status, additional functional criteria and insurance authorization.   Follow Up Recommendations  No OT follow up     Assistance Recommended at Discharge PRN  Patient can return home with the following Assist for transportation (initially)    Functional Status Assessment  Patient has had a recent decline in their functional status and demonstrates the ability to make significant improvements in function in a reasonable and predictable amount of time.  Equipment Recommendations  None recommended by OT    Recommendations for Other Services       Precautions / Restrictions Precautions Precautions: None Restrictions Weight Bearing Restrictions: No      Mobility Bed Mobility Overal bed mobility: Modified Independent             General bed mobility comments:  HOB elevated, no assistance needed    Transfers Overall transfer level: Independent Equipment used: None               General transfer comment: No assistance needed, no LOB      Balance Overall balance assessment: Mild deficits observed, not formally tested (very mild, appears to be baseline per pt)                                         ADL either performed or assessed with clinical judgement   ADL Overall ADL's : At baseline                                       General ADL Comments: Patient is back to baseline with ADLs     Vision Baseline Vision/History: 1 Wears glasses Ability to See in Adequate Light: 0 Adequate Patient Visual Report: No change from baseline (Double vision has now resolved) Vision Assessment?: Yes Eye Alignment: Within Functional Limits Ocular Range of Motion: Within Functional Limits Alignment/Gaze Preference: Within Defined Limits Tracking/Visual Pursuits: Able to track stimulus in all quads without difficulty Saccades: Within functional limits Convergence: Within functional limits Visual Fields: No apparent deficits Additional Comments: Double vision resolved, R eye is stronger than L so compensates with a slight gaze preference, patient reports this is baseline     Perception     Praxis      Pertinent Vitals/Pain Pain Assessment Pain Assessment: No/denies pain  Hand Dominance Right   Extremity/Trunk Assessment Upper Extremity Assessment Upper Extremity Assessment: Overall WFL for tasks assessed   Lower Extremity Assessment Lower Extremity Assessment: Defer to PT evaluation RLE Deficits / Details: MMT scores of 5 grossly throughout; denied numbness/tingling; mild gross incoordination bil RLE Sensation: WNL RLE Coordination: decreased gross motor LLE Deficits / Details: MMT scores of 5 grossly throughout (potential mild weakness noted compared to R side); denied numbness/tingling; mild gross  incoordination bil LLE Sensation: WNL LLE Coordination: decreased gross motor   Cervical / Trunk Assessment Cervical / Trunk Assessment: Normal   Communication Communication Communication: No difficulties   Cognition Arousal/Alertness: Awake/alert Behavior During Therapy: WFL for tasks assessed/performed Overall Cognitive Status: Within Functional Limits for tasks assessed                                       General Comments  educated pt on "BE FAST"    Exercises     Shoulder Instructions      Home Living Family/patient expects to be discharged to:: Private residence Living Arrangements: Spouse/significant other;Other relatives (grandson and his family; husband is in w/c) Available Help at Discharge: Family Type of Home: House Home Access: Stairs to enter;Ramped entrance Entrance Stairs-Number of Steps: 1   Home Layout: Two level;Able to live on main level with bedroom/bathroom (rarely goes upstairs)     Bathroom Shower/Tub: Walk-in shower;Tub only   Bathroom Toilet: Standard     Home Equipment: Grab bars - tub/shower;Shower seat;BSC/3in1;Rolling Environmental consultant (2 wheels);Wheelchair - manual      Lives With: Spouse    Prior Functioning/Environment Prior Level of Function : Driving;Independent/Modified Independent             Mobility Comments: Mild imbalance at baseline but does not use AD ADLs Comments: Uses tackle boxes for safe medication storage (away from grandson with Autism) and for sorting        OT Problem List: Decreased activity tolerance      OT Treatment/Interventions:      OT Goals(Current goals can be found in the care plan section) Acute Rehab OT Goals Patient Stated Goal: to go home OT Goal Formulation: With patient Time For Goal Achievement: 01/13/23 Potential to Achieve Goals: Good  OT Frequency:      Co-evaluation PT/OT/SLP Co-Evaluation/Treatment: Yes Reason for Co-Treatment: For patient/therapist safety;To address  functional/ADL transfers PT goals addressed during session: Mobility/safety with mobility;Balance OT goals addressed during session: ADL's and self-care;Proper use of Adaptive equipment and DME;Strengthening/ROM      AM-PAC OT "6 Clicks" Daily Activity     Outcome Measure Help from another person eating meals?: None Help from another person taking care of personal grooming?: None Help from another person toileting, which includes using toliet, bedpan, or urinal?: None Help from another person bathing (including washing, rinsing, drying)?: None Help from another person to put on and taking off regular upper body clothing?: None Help from another person to put on and taking off regular lower body clothing?: None 6 Click Score: 24   End of Session Nurse Communication: Mobility status  Activity Tolerance: Patient tolerated treatment well Patient left: in bed;with call bell/phone within reach  OT Visit Diagnosis: Unsteadiness on feet (R26.81)                [id number] OT Time Calculation (min): 23 min Charges:  OT General Charges $OT Visit: 1 Visit OT Evaluation $OT Eval  Moderate Complexity: 1 Mod  [place] Rexton Greulich, OTR/L Acute Rehabilitation Services [phone]   Ascencion Dike 12/30/2022, 11:06 AM

## 2022-12-30 NOTE — Progress Notes (Signed)
Daily Progress Note Intern Pager: [phone]  Patient name: Cheryl Levy Medical record number: [id number] Date of birth: 1951/09/07 Age: 72 y.o. Gender: female  Primary Care Provider: Elisabeth Cara, [place] Consultants: Neuro  Code Status: FULL   Preferred Emergency Contact:  Husband: [phone]  Daughter: Babs Bertin [id number]  Pt Overview and Major Events to Date:  2/20 admitted  Assessment and Plan: Cheryl Levy is a 72 y.o. female presenting with dizziness, double vision, and confusion. PMHx breast cancer in remission, HTN, thyroid disease.  * Stroke [facility]) On exam today patient had no focal neurologic deficits. Alert and oriented x 4. Appears back to baseline. LDL 93. A1c 5.7. 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 B12 164 - neuro following, appreciate recs - frequent neuro checks - fall, delirium precautions - continue aspirin 81 mg daily, clopidogrel 75 mg daily - PT/OT/SLP - start b12 1000 mcg daily - f/u ammonia - f/u carotid US, echo  HTN (hypertension) BP 140s/70s. Goal is normotensive per neuro - continue home clonidine 0.2 mg BID - start amlodipine 2.5 mg daily - monitor BP, keep normotensive  Creatinine elevation Cr on admission 1.63 but now downtrended to 1.48. Prior recorded Cr of 1.60 was in 2015. Unsure of baseline. 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 Denies headaches, weakness, or confusion. She feels back to baseline.  Objective: Temp:  [97.8 F (36.6 C)-98.4 F (36.9  C)] 98.1 F (36.7 C) (02/21 1000) Pulse Rate:  [62-77] 71 (02/21 1000) Resp:  [14-20] 16 (02/21 1000) BP: (117-201)/(51-89) 175/82 (02/21 1000) SpO2:  [95 %-100 %] 99 % (02/21 1000) Physical Exam: General: in no acute distress and well-appearing HEENT: normocephalic and atraumatic Respiratory: clear to auscultation bilaterally posteriorly, non-labored breathing, and on RA Extremities: moving all extremities spontaneously Gastrointestinal: non-tender and non-distended Cardiovascular: regular rate Neuro: alert, oriented x 4. No focal neurologic deficits.  Laboratory: Most recent CBC Lab Results  Component Value Date   WBC 4.5 12/30/2022   HGB 13.6 12/30/2022   HCT 41.4 12/30/2022   MCV 86.3 12/30/2022   PLT 175 12/30/2022   Most recent BMP    Latest Ref Rng & Units 12/30/2022    7:16 AM  BMP  Glucose 70 - 99 mg/dL 108   BUN 8 - 23 mg/dL 29   Creatinine 0.44 - 1.00 mg/dL 1.48   Sodium 135 - 145 mmol/L 138   Potassium 3.5 - 5.1 mmol/L 3.6   Chloride 98 - 111 mmol/L 103   CO2 22 - 32 mmol/L 26   Calcium 8.9 - 10.3 mg/dL 8.5     Imaging/Diagnostic Tests:  MR angio head (2/21) IMPRESSION: 1. Approximately 4 x 2 tubular outpouching rising from the basilar tip is favored to represent a thrombosed left P1 PCA. The left posterior communicating artery supplies the left posterior cerebral artery which is otherwise patent. 2. Moderate right distal P2 PCA stenosis. 3. 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  Camelia Phenes, MD 12/30/2022, 12:23 PM  PGY-1, [place] Intern pager: [phone], text pages welcome Secure chat group [place]

## 2022-12-30 NOTE — Care Management Obs Status (Signed)
[place] NOTIFICATION   Patient Details  Name: Cheryl Levy MRN: [id number] Date of Birth: 1951-08-03   Medicare Observation Status Notification Given:  Yes    Pollie Friar, RN 12/30/2022, 1:22 PM

## 2022-12-30 NOTE — Plan of Care (Signed)
  Problem: Education: Goal: Knowledge of disease or condition will improve Outcome: Progressing Goal: Knowledge of secondary prevention will improve (MUST DOCUMENT ALL) Outcome: Progressing Goal: Knowledge of patient specific risk factors will improve [place] N/A or DELETE if not current risk factor) Outcome: Progressing   Problem: Ischemic Stroke/TIA Tissue Perfusion: Goal: Complications of ischemic stroke/TIA will be minimized Outcome: Progressing

## 2022-12-30 NOTE — Hospital Course (Addendum)
Cheryl Levy is a 72 y.o. female presenting with dizziness, double vision, and confusion found to have acute / subacute stroke.  Stroke Patient presented after transient episode of dizziness, double vision, and confusion. MRI showed hyperintense lesion within the left aspect of the callosal splenium with circumferential restricted diffusion at its periphery and acute/early subacute lacunar infarct within the right lentiform nucleus. No metastasis. MRA showed thrombosed left P1 PCA, moderate right distal P2 PCA stenosis, and L ICA aneurysm. Bilateral carotid US showed 1 - 39% stenosis. For stroke prophylaxis, was started on aspirin, clopidogrel, and switched to high-intensity statin. At discharge patient was back to baseline other than very mild anomic aphasia. Per Neuro, recommend 30 day cardiac monitoring on discharge due to bilateral CVAs.  HTN [employer] in [id number] on admission. By time of discharge BP was stable. Continued on home clonidine while hospitalized and started on amlodipine 72m to achieve normotension.  Creatinine elevation Cr. 1.63 on admission likely in the setting of dehydration. Downtrending and stable during hospital stay.  Patient was continued on home medications for Narcolepsy, Anxiety and GERD.  PCP follow up: BMP to trend renal function Monitor BP since started on Amlodipine in addition to home meds

## 2022-12-30 NOTE — TOC Transition Note (Signed)
Transition of Care [facility]) - CM/SW Discharge Note   Patient Details  Name: Sharran Dausch MRN: [id number] Date of Birth: Mar 24, 1951  Transition of [facility]) CM/SW Contact:  Pollie Friar, RN Phone Number: 12/30/2022, 1:23 PM   Clinical Narrative:    Pt is from home with her spouse, son and sons family. She states someone is with her most of the time.  She does the needed transportation as her spouse can't drive. She says her son can provide transportation if needed. She denies any issues with her home medications. She manages them herself.  Pt with recommendations for outpt ST. Pt has declined these services.  Pt confirms she has transport home when discharged.    Final next level of care: Home/Self Care Barriers to Discharge: No Barriers Identified   Patient Goals and CMS Choice      Discharge Placement                         Discharge Plan and Services Additional resources added to the After Visit Summary for                                       Social Determinants of Health (SDOH) Interventions SDOH Screenings   Food Insecurity: No Food Insecurity (12/29/2022)  Housing: Low Risk  (12/29/2022)  Transportation Needs: No Transportation Needs (12/29/2022)  Utilities: Not At Risk (12/29/2022)  Tobacco Use: Low Risk  (12/29/2022)     Readmission Risk Interventions     No data to display

## 2022-12-30 NOTE — Evaluation (Signed)
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 female who presented 12/29/22 with confusion, dizziness, double vision, and L-sided weakness. MRI revealed lesion within the left aspect of the callosal splenium, which may reflect a subacute infarct, along with an acute/early subacute lacunar infarct within the right lentiform nucleus. PMH: cancer, HTN, thyroid disease,   Assessment / Plan / Recommendation Clinical Impression  Pt participated in speech-language-cognition evaluation. 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 She reported that she has been having difficulty with memory prior to admission and that she was seen by a neuropsychologist at [facility] in 2023 who did not diagnose her with dementia; these notes could not be found in [place]. Pt initially denied any acute changes in cognition, but at the end of the evaluation she stated that she was "slow" with processing for more complex tasks.  [facility] Mental Status Examination was completed to evaluate the pt's cognitive-linguistic skills. She achieved a  score of 19/30 which is below the normal limits of 27 or more out of 30. She exhibited difficulty in the areas of memory (reportedly at baseline), and executive function as it relates to complex problem solving. Motor speech and language skills were [facility]. Skilled SLP services are clinically indicated at this time to target cognitive-linguistic function.    SLP Assessment  SLP Recommendation/Assessment: Patient needs continued Speech [place] [facility] SLP Visit Diagnosis: Cognitive communication deficit (R41.841)    Recommendations for follow up therapy are one component of a multi-disciplinary discharge planning process, led by the attending physician.  Recommendations may be updated based on patient status, additional functional criteria and insurance authorization.    Follow Up Recommendations  Outpatient SLP    Assistance Recommended at Discharge  Intermittent Supervision/Assistance  Functional Status Assessment Patient has had a recent decline in their functional status and demonstrates the ability to make significant improvements in function in a reasonable and predictable amount of time.  Frequency and Duration min 2x/week  2 weeks      SLP Evaluation Cognition  Overall Cognitive Status: Within Functional Limits for tasks assessed Arousal/Alertness: Awake/alert Orientation Level: Oriented X4 Year: 2024 Month: February Day of Week: Correct Attention: Focused;Sustained Focused Attention: Appears intact Sustained Attention: Appears intact Memory: Impaired Memory Impairment:  (Immediate: 5/5; delayed: 3/5; with cues: 1/2) Awareness: Impaired Awareness Impairment: Emergent impairment Problem Solving: Impaired Problem Solving Impairment: Verbal complex (money: 1/2) Executive Function: Sequencing;Organizing Sequencing: Impaired Sequencing Impairment: Verbal complex (clock: 2/4) Organizing:  (backward digit span: 2/2)       Comprehension  Auditory  Comprehension Overall Auditory  Comprehension: Appears within functional limits for tasks assessed Yes/No Questions: Within Functional Limits Commands: Within Functional Limits Conversation: Complex    Expression Expression Primary Mode of Expression: Verbal Verbal Expression Overall Verbal Expression: Appears within functional limits for tasks assessed Initiation: No impairment Level of Generative/Spontaneous Verbalization: Conversation Repetition: No impairment Naming: No impairment Pragmatics: No impairment Written Expression Dominant Hand: Right   Oral / Motor  Oral Motor/Sensory Function Overall Oral Motor/Sensory Function: Within functional limits Motor Speech Overall Motor Speech: Appears within functional limits for tasks assessed Respiration: Within functional limits Phonation: Normal Resonance: Within functional limits Articulation: Within functional limitis Intelligibility: Intelligible Motor Planning: Witnin functional limits Motor Speech Errors: Not applicable           Cheryl Levy I. Hardin Negus, [place], [place] Office number [phone]  Horton Marshall 12/30/2022, 10:09 AM

## 2022-12-30 NOTE — Progress Notes (Signed)
  Echocardiogram 2D Echocardiogram has been performed.  Cheryl Levy 12/30/2022, 11:16 AM

## 2022-12-30 NOTE — Evaluation (Signed)
Physical Therapy Evaluation & Discharge Patient Details Name: Cheryl Levy MRN: [id number] DOB: 12-07-1950 Today's Date: 12/30/2022  History of Present Illness  Pt is a 72 y.o. female who presented 12/29/22 with confusion, dizziness, double vision, and L-sided weakness. MRI revealed lesion within the left aspect of the callosal splenium, which may reflect a subacute infarct, along with an acute/early subacute lacunar infarct within the right lentiform nucleus. PMH: cancer, HTN, thyroid disease   Clinical Impression  Pt presents with condition above. PTA, she was living with her husband and her grandson and his family in a 2-level house with 1 STE. Pt rarely goes upstairs. Pt did demonstrate some mild bil lower extremity gross incoordination and potential very minor L leg weakness compared to the R, but still scored a 5/5 with MMT and demonstrated [facility] bil lower extremity strength. She reports some mild balance deficits at baseline, which was observed this session, but pt did not have any overt LOB. Pt reports and appears to be functioning at her baseline. All education completed and questions answered. PT will sign off.     Recommendations for follow up therapy are one component of a multi-disciplinary discharge planning process, led by the attending physician.  Recommendations may be updated based on patient status, additional functional criteria and insurance authorization.  Follow Up Recommendations No PT follow up      Assistance Recommended at Discharge None  Patient can return home with the following       Equipment Recommendations None recommended by PT  Recommendations for Other Services       Functional Status Assessment Patient has not had a recent decline in their functional status     Precautions / Restrictions Precautions Precautions: None Restrictions Weight Bearing Restrictions: No      Mobility  Bed Mobility Overal bed mobility: Modified Independent              General bed mobility comments: HOB elevated, no assistance needed    Transfers Overall transfer level: Independent Equipment used: None               General transfer comment: No assistance needed, no LOB    Ambulation/Gait Ambulation/Gait assistance: Supervision, +2 safety/equipment Gait Distance (Feet): 250 Feet Assistive device: None Gait Pattern/deviations: Step-through pattern, Decreased stride length Gait velocity: reduced Gait velocity interpretation: 1.31 - 2.62 ft/sec, indicative of limited community ambulator   General Gait Details: Pt with fairly steady gait, but did display some mild istability/narrow placement of feet but no overt LOB when cued to turn head L <> R and nod head up <> down. Supervision for safety. No other significant gait deviations noted  Stairs            Wheelchair Mobility    Modified Rankin (Stroke Patients Only) Modified Rankin (Stroke Patients Only) Pre-Morbid Rankin Score: No symptoms Modified Rankin: No symptoms     Balance Overall balance assessment: Mild deficits observed, not formally tested (very mild, appears to be baseline per pt)                                           Pertinent Vitals/Pain Pain Assessment Pain Assessment: No/denies pain    Home Living Family/patient expects to be discharged to:: Private residence Living Arrangements: Spouse/significant other;Other relatives (grandson and his family; husband is in w/c) Available Help at Discharge: Family Type of Home: House Home Access:  Stairs to enter;Ramped entrance   Entrance Stairs-Number of Steps: 1   Home Layout: Two level;Able to live on main level with bedroom/bathroom (rarely goes upstairs) Home Equipment: Grab bars - tub/shower;Shower seat;BSC/3in1;Rolling Environmental consultant (2 wheels);Wheelchair - manual      Prior Function Prior Level of Function : Driving;Independent/Modified Independent             Mobility Comments: Mild  imbalance at baseline but does not use AD ADLs Comments: Uses tackle boxes for safe medication storage (away from grandson with Autism) and for sorting     Hand Dominance   Dominant Hand: Right    Extremity/Trunk Assessment   Upper Extremity Assessment Upper Extremity Assessment: Defer to OT evaluation    Lower Extremity Assessment Lower Extremity Assessment: LLE deficits/detail;RLE deficits/detail RLE Deficits / Details: MMT scores of 5 grossly throughout; denied numbness/tingling; mild gross incoordination bil RLE Sensation: WNL RLE Coordination: decreased gross motor LLE Deficits / Details: MMT scores of 5 grossly throughout (potential mild weakness noted compared to R side); denied numbness/tingling; mild gross incoordination bil LLE Sensation: WNL LLE Coordination: decreased gross motor    Cervical / Trunk Assessment Cervical / Trunk Assessment: Normal  Communication   Communication: No difficulties  Cognition Arousal/Alertness: Awake/alert Behavior During Therapy: WFL for tasks assessed/performed Overall Cognitive Status: Within Functional Limits for tasks assessed                                          General Comments General comments (skin integrity, edema, etc.): educated pt on "BE FAST"    Exercises     Assessment/Plan    PT Assessment Patient does not need any further PT services  PT Problem List         PT Treatment Interventions      PT Goals (Current goals can be found in the Care Plan section)  Acute Rehab PT Goals Patient Stated Goal: to go home PT Goal Formulation: All assessment and education complete, DC therapy Time For Goal Achievement: 12/31/22 Potential to Achieve Goals: Good    Frequency       Co-evaluation PT/OT/SLP Co-Evaluation/Treatment: Yes Reason for Co-Treatment: For patient/therapist safety;To address functional/ADL transfers PT goals addressed during session: Mobility/safety with mobility;Balance          AM-PAC PT "6 Clicks" Mobility  Outcome Measure Help needed turning from your back to your side while in a flat bed without using bedrails?: None Help needed moving from lying on your back to sitting on the side of a flat bed without using bedrails?: None Help needed moving to and from a bed to a chair (including a wheelchair)?: None Help needed standing up from a chair using your arms (e.g., wheelchair or bedside chair)?: None Help needed to walk in hospital room?: A Little Help needed climbing 3-5 steps with a railing? : A Little 6 Click Score: 22    End of Session   Activity Tolerance: Patient tolerated treatment well Patient left: in bed;with call bell/phone within reach Nurse Communication: Mobility status PT Visit Diagnosis: Unsteadiness on feet (R26.81)    [id number] PT Time Calculation (min) (ACUTE ONLY): 21 min   Charges:   PT Evaluation $PT Eval Low Complexity: 1 Low          Moishe Spice, PT, DPT Acute Rehabilitation Services  Office: [phone]   Orvan Falconer 12/30/2022, 9:09 AM

## 2022-12-30 NOTE — Progress Notes (Addendum)
FMTS Interim Progress Note  S: Patient was asleep resting comfortably in bed.  O: BP (!) 168/67 (BP Location: Right Arm)   Pulse 77   Temp 97.8 F (36.6 C) (Oral)   Resp 16   Ht 5' 5"$  (1.651 m)   Wt 71.9 kg   SpO2 95%   BMI 26.38 kg/m    General: Resting comfortably in bed, asleep  Reviewed telemetry, no significant events at this time.  A/P: Stroke - Continue aspirin, received clopidogrel 300 loading dose  HTN - Brief episode of hypotension 117/51 at 2000.  Pressure increased to 168/67 @2300$ .  We have not dosed any additional as needed blood pressure medication.  Continue to monitor  Leslie Dales, DO 12/30/2022, 1:54 AM PGY-1, [place] [facility] pager [phone]

## 2022-12-30 NOTE — Progress Notes (Signed)
STROKE TEAM PROGRESS NOTE   INTERVAL HISTORY Her significant other is at the bedside.  No acute events overnight. Stroke workup imaging and plan of care reviewed with patient and significant other at bedside with Dr Erlinda Hong.  Patient's symptoms have improved, back to baseline.  Vitals:   12/29/22 2319 12/30/22 0355 12/30/22 1000 12/30/22 1235  BP: (!) 168/67 (!) 146/73 (!) 175/82 133/69  Pulse: 77 62 71 69  Resp: 16 14 16 16  $ Temp: 97.8 F (36.6 C) 98.4 F (36.9 C) 98.1 F (36.7 C) 97.9 F (36.6 C)  TempSrc: Oral  Oral Oral  SpO2: 95% 100% 99% 98%  Weight:      Height:       CBC:  Recent Labs  Lab 12/29/22 1105 12/30/22 0716  WBC 5.0 4.5  HGB 12.9 13.6  HCT 40.0 41.4  MCV 88.3 86.3  PLT 178 0000000   Basic Metabolic Panel:  Recent Labs  Lab 12/29/22 1105 12/30/22 0716  NA 139 138  K 3.9 3.6  CL 106 103  CO2 29 26  GLUCOSE 130* 108*  BUN 40* 29*  CREATININE 1.63* 1.48*  CALCIUM 8.5* 8.5*   Lipid Panel:  Recent Labs  Lab 12/29/22 1828  CHOL 151  TRIG 117  HDL 35*  CHOLHDL 4.3  VLDL 23  LDLCALC 93   HgbA1c:  Recent Labs  Lab 12/29/22 1828  HGBA1C 5.7*   Urine Drug Screen: No results for input(s): "LABOPIA", "COCAINSCRNUR", "LABBENZ", "AMPHETMU", "THCU", "LABBARB" in the last 168 hours.  Alcohol Level No results for input(s): "ETH" in the last 168 hours.  IMAGING past 24 hours ECHOCARDIOGRAM COMPLETE  Result Date: 12/30/2022    ECHOCARDIOGRAM REPORT   Patient Name:   Cheryl Levy Date of Exam: 12/30/2022 Medical Rec #:  [id number]    Height:       65.0 in Accession #:    [id number]   Weight:       158.5 lb Date of Birth:  July 28, 1951    BSA:          1.792 m Patient Age:    72 years     BP:           146/73 mmHg Patient Gender: F            HR:           80 bpm. Exam Location:  Inpatient Procedure: 2D Echo Indications:    stroke  History:        Patient has no prior history of Echocardiogram examinations.                 Stroke; Risk Factors:Hypertension.   Sonographer:    Harvie Junior Referring Phys: [id number] [place]  Sonographer Comments: Technically difficult study due to poor echo windows. Image acquisition challenging due to breast implants and Image acquisition challenging due to mastectomy. IMPRESSIONS  1. Left ventricular ejection fraction, by estimation, is 55 to 60%. The left ventricle has normal function. The left ventricle has no regional wall motion abnormalities. There is mild concentric left ventricular hypertrophy. Left ventricular diastolic parameters are consistent with Grade I diastolic dysfunction (impaired relaxation).  2. Right ventricular systolic function is normal. The right ventricular size is normal.  3. The mitral valve is normal in structure. Trivial mitral valve regurgitation. No evidence of mitral stenosis.  4. The aortic valve was not well visualized. Aortic valve regurgitation is mild. No aortic stenosis is present.  5. The inferior vena cava is  normal in size with greater than 50% respiratory variability, suggesting right atrial pressure of 3 mmHg. FINDINGS  Left Ventricle: Left ventricular ejection fraction, by estimation, is 55 to 60%. The left ventricle has normal function. The left ventricle has no regional wall motion abnormalities. The left ventricular internal cavity size was normal in size. There is  mild concentric left ventricular hypertrophy. Left ventricular diastolic parameters are consistent with Grade I diastolic dysfunction (impaired relaxation). Normal left ventricular filling pressure. Right Ventricle: The right ventricular size is normal. No increase in right ventricular wall thickness. Right ventricular systolic function is normal. Left Atrium: Left atrial size was normal in size. Right Atrium: Right atrial size was normal in size. Pericardium: There is no evidence of pericardial effusion. Mitral Valve: The mitral valve is normal in structure. Trivial mitral valve regurgitation. No evidence of mitral valve  stenosis. Tricuspid Valve: The tricuspid valve is normal in structure. Tricuspid valve regurgitation is not demonstrated. No evidence of tricuspid stenosis. Aortic Valve: The aortic valve was not well visualized. Aortic valve regurgitation is mild. Aortic regurgitation PHT measures 504 msec. No aortic stenosis is present. Aortic valve mean gradient measures 3.5 mmHg. Aortic valve peak gradient measures 6.1 mmHg. Aortic valve area, by VTI measures 2.17 cm. Pulmonic Valve: The pulmonic valve was normal in structure. Pulmonic valve regurgitation is not visualized. No evidence of pulmonic stenosis. Aorta: The aortic root is normal in size and structure. Venous: The inferior vena cava is normal in size with greater than 50% respiratory variability, suggesting right atrial pressure of 3 mmHg. IAS/Shunts: No atrial level shunt detected by color flow Doppler.  LEFT VENTRICLE PLAX 2D LVIDd:         4.05 cm      Diastology LVIDs:         3.00 cm      LV e' medial:    6.47 cm/s LV PW:         1.10 cm      LV E/e' medial:  8.3 LV IVS:        1.05 cm      LV e' lateral:   8.10 cm/s LVOT diam:     2.00 cm      LV E/e' lateral: 6.6 LV SV:         54 LV SV Index:   30 LVOT Area:     3.14 cm                              3D Volume EF: LV Volumes (MOD)            3D EF:        48 % LV vol d, MOD A2C: 67.9 ml  LV EDV:       116 ml LV vol d, MOD A4C: 111.0 ml LV ESV:       60 ml LV vol s, MOD A2C: 34.5 ml  LV SV:        55 ml LV vol s, MOD A4C: 49.8 ml LV SV MOD A2C:     33.4 ml LV SV MOD A4C:     111.0 ml LV SV MOD BP:      51.3 ml RIGHT VENTRICLE RV Basal diam:  2.40 cm RV Mid diam:    2.20 cm RV S prime:     19.90 cm/s TAPSE (M-mode): 2.1 cm LEFT ATRIUM  Index        RIGHT ATRIUM           Index LA Vol (A2C):   40.1 ml 22.38 ml/m  RA Area:     11.80 cm LA Vol (A4C):   40.5 ml 22.60 ml/m  RA Volume:   27.50 ml  15.35 ml/m LA Biplane Vol: 40.7 ml 22.71 ml/m  AORTIC VALVE                    PULMONIC VALVE AV Area  (Vmax):    2.25 cm     PV Vmax:       0.90 m/s AV Area (Vmean):   2.12 cm     PV Peak grad:  3.2 mmHg AV Area (VTI):     2.17 cm AV Vmax:           123.50 cm/s AV Vmean:          84.200 cm/s AV VTI:            0.247 m AV Peak Grad:      6.1 mmHg AV Mean Grad:      3.5 mmHg LVOT Vmax:         88.55 cm/s LVOT Vmean:        56.850 cm/s LVOT VTI:          0.171 m LVOT/AV VTI ratio: 0.69 AI PHT:            504 msec  AORTA Ao Root diam: 3.30 cm Ao Asc diam:  2.90 cm MITRAL VALVE MV Area (PHT): 2.95 cm    SHUNTS MV Decel Time: 257 msec    Systemic VTI:  0.17 m MR Peak grad: 35.8 mmHg    Systemic Diam: 2.00 cm MR Vmax:      299.00 cm/s MV E velocity: 53.50 cm/s MV A velocity: 89.10 cm/s MV E/A ratio:  0.60 Skeet Latch MD Electronically signed by Skeet Latch MD Signature Date/Time: 12/30/2022/1:38:24 PM    Final    VAS US CAROTID  Result Date: 12/30/2022 Carotid Arterial Duplex Study Patient Name:  AKEISHA FRANCES  Date of Exam:   12/30/2022 Medical Rec #: [id number]     Accession #:    [id number] Date of Birth: January 13, 1951     Patient Gender: F Patient Age:   72 years Exam Location:  [facility] Procedure:      VAS US CAROTID Referring Phys: TODD MCDIARMID --------------------------------------------------------------------------------  Indications:       Visual disturbance and Weakness. Risk Factors:      Hypertension, hyperlipidemia. Comparison Study:  No prior study. Performing Technologist: Candie Chroman  Examination Guidelines: A complete evaluation includes B-mode imaging, spectral Doppler, color Doppler, and power Doppler as needed of all accessible portions of each vessel. Bilateral testing is considered an integral part of a complete examination. Limited examinations for reoccurring indications may be performed as noted.  Right Carotid Findings: +----------+--------+--------+--------+------------------+--------+           PSV cm/sEDV cm/sStenosisPlaque DescriptionComments  +----------+--------+--------+--------+------------------+--------+ CCA Prox  93      12                                         +----------+--------+--------+--------+------------------+--------+ CCA Distal60      11                                         +----------+--------+--------+--------+------------------+--------+  ICA Prox  62      13      1-39%                              +----------+--------+--------+--------+------------------+--------+ ICA Mid   62      14                                         +----------+--------+--------+--------+------------------+--------+ ICA Distal67      14                                         +----------+--------+--------+--------+------------------+--------+ ECA       121     12                                         +----------+--------+--------+--------+------------------+--------+ +----------+--------+-------+----------------+-------------------+           PSV cm/sEDV cmsDescribe        Arm Pressure (mmHG) +----------+--------+-------+----------------+-------------------+ [id number]            Multiphasic, WNL                    +----------+--------+-------+----------------+-------------------+ +---------+--------+--+--------+-+---------+ VertebralPSV cm/s35EDV cm/s7Antegrade +---------+--------+--+--------+-+---------+  Left Carotid Findings: +----------+--------+--------+--------+------------------+--------+           PSV cm/sEDV cm/sStenosisPlaque DescriptionComments +----------+--------+--------+--------+------------------+--------+ CCA Prox  102     10                                         +----------+--------+--------+--------+------------------+--------+ CCA Distal63      11                                         +----------+--------+--------+--------+------------------+--------+ ICA Prox  86      19      1-39%                               +----------+--------+--------+--------+------------------+--------+ ICA Mid   86      15                                         +----------+--------+--------+--------+------------------+--------+ ICA Distal57      14                                         +----------+--------+--------+--------+------------------+--------+ ECA       148     9                                          +----------+--------+--------+--------+------------------+--------+ +----------+--------+--------+----------------+-------------------+           PSV cm/sEDV  cm/sDescribe        Arm Pressure (mmHG) +----------+--------+--------+----------------+-------------------+ Subclavian109             Multiphasic, WNL                    +----------+--------+--------+----------------+-------------------+ +---------+--------+--+--------+--+---------+ VertebralPSV cm/s69EDV cm/s13Antegrade +---------+--------+--+--------+--+---------+   Summary: Right Carotid: Velocities in the right ICA are consistent with a 1-39% stenosis. Left Carotid: Velocities in the left ICA are consistent with a 1-39% stenosis. Vertebrals:  Bilateral vertebral arteries demonstrate antegrade flow. Subclavians: Normal flow hemodynamics were seen in bilateral subclavian              arteries. *See table(s) above for measurements and observations.     Preliminary    MR ANGIO HEAD WO CONTRAST  Result Date: 12/30/2022 CLINICAL DATA:  Stroke/TIA, determine embolic source EXAM: MRA HEAD WITHOUT CONTRAST TECHNIQUE: Angiographic images of the Circle of Willis were acquired using MRA technique without intravenous contrast. COMPARISON:  None Available. FINDINGS: Anterior circulation: Bilateral intracranial ICAs,, MCAs, and ACAs are patent without proximal hemodynamically significant stenosis. 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 Posterior circulation: There is a tubular 4 x 2 mm outpouching  arising from the basilar tip anteriorly, which is favored to represent a thrombosed P1 PCA given suggestion of a vessel arising from the tip on reformats and the tubular appearance. The left posterior communicating artery supplies the left posterior cerebral artery which is otherwise patent. Moderate right P2 PCA stenosis. Bilateral intradural vertebral arteries and basilar artery are patent without significant stenosis. IMPRESSION: 1. Approximately 4 x 2 tubular outpouching rising from the basilar tip is favored to represent a thrombosed left P1 PCA. The left posterior communicating artery supplies the left posterior cerebral artery which is otherwise patent. 2. Moderate right distal P2 PCA stenosis. 3. 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 Electronically Signed   By: Margaretha Sheffield M.D.   On: 12/30/2022 08:38   MR BRAIN W CONTRAST  Result Date: 12/29/2022 CLINICAL DATA:  double vision, diffiuculty walking EXAM: MRI HEAD WITH CONTRAST TECHNIQUE: Multiplanar, multiecho pulse sequences of the brain and surrounding structures were obtained with intravenous contrast. CONTRAST:  7.59m GADAVIST GADOBUTROL 1 MMOL/ML IV SOLN COMPARISON:  February 20, 24. FINDINGS: Postcontrast imaging was obtained to further evaluate the abnormality seen on same day MRI head. The lesion in the left aspect of the splenium of the corpus callosum does not demonstrate significant enhancement. No pathologic enhancement elsewhere. IMPRESSION: The lesion in the left aspect of the splenium of the corpus callosum does not demonstrate significant enhancement and is not compatible with a metastasis. Electronically Signed   By: FMargaretha SheffieldM.D.   On: 12/29/2022 15:52   CT Head Wo Contrast  Result Date: 12/29/2022 CLINICAL DATA:  Ataxia.  Headache. EXAM: CT HEAD WITHOUT CONTRAST TECHNIQUE: Contiguous axial images were obtained from the base of the skull through the vertex without  intravenous contrast. RADIATION DOSE REDUCTION: This exam was performed according to the departmental dose-optimization program which includes automated exposure control, adjustment of the mA and/or kV according to patient size and/or use of iterative reconstruction technique. COMPARISON:  Same day brain MRI FINDINGS: Brain: Focal hypodensity along the lateral aspect of the splenium of the corpus callosum on the left is better assessed on same day brain MRI. There is no evidence of hemorrhage. No extra-axial fluid collection. No hydrocephalus. There is sequela of moderate chronic microvascular  ischemic change, which is better seen on same day brain MRI. Vascular: No hyperdense vessel or unexpected calcification. Skull: The floor of the sella is in, but not definitively dehiscent. Sinuses/Orbits: No acute finding. Other: None. IMPRESSION: Focal hypodensity along the left lateral aspect of the splenium of the corpus callosum is better assessed on same day brain MRI. No evidence of hemorrhage. Electronically Signed   By: Marin Roberts M.D.   On: 12/29/2022 14:30    PHYSICAL EXAM  Temp:  [97.8 F (36.6 C)-98.4 F (36.9 C)] 97.9 F (36.6 C) (02/21 1235) Pulse Rate:  [62-77] 69 (02/21 1235) Resp:  [14-16] 16 (02/21 1235) BP: (117-201)/(51-89) 133/69 (02/21 1235) SpO2:  [95 %-100 %] 98 % (02/21 1235)  General - Well nourished, well developed, in no apparent distress Cardiovascular - Regular rhythm and rate  Pulmonary -unlabored breathing, no supplemental oxygenation needed.  Mental Status -  Level of arousal and orientation to time, place, and person were intact. Language including expression, naming, repetition, comprehension was assessed and found intact. Attention span and concentration were normal. Recent and remote memory were intact. Fund of Knowledge was assessed and was intact.  Cranial Nerves II - XII - II - Visual field intact OU. III, IV, VI - Extraocular movements intact. V - Facial  sensation intact bilaterally. VII - Facial movement intact bilaterally. VIII - Hearing & vestibular intact bilaterally. X - Palate elevates symmetrically. XI - Chin turning & shoulder shrug intact bilaterally. XII - Tongue protrusion intact.  Motor Strength - The patient's strength was normal in all extremities and pronator drift was absent.   Motor Tone -bulk and tone normal.  Sensory - Light touch intact and symmetric throughout.  Coordination - The patient had normal movements in the hands and feet with no ataxia or dysmetria.  Tremor was absent.  Gait and Station - deferred.  ASSESSMENT/PLAN Ms. Cheryl Levy is a 72 y.o. female with history of breast cancer in remission since 2017, hypertension, thyroid disease presenting with dizziness, double vision, confusion, difficulty walking since 12/25/2022.  Transient diplopia and right hand writing difficulty yesterday.  Currently symptoms resolved.  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  No evidence of hemorrhage. MRI  w/o:  8 mm T2 FLAIR hyperintense lesion within the left aspect of the callosal splenium with circumferential restricted diffusion at its periphery. This may reflect a subacute infarct.  3 mm acute/early subacute lacunar infarct within the right lentiform nucleus. MRI w/ and wo: The lesion in the left aspect of the splenium of the corpus callosum does not demonstrate significant enhancement and is not compatible with a metastasis. MRA: Approximately 4 x 2 tubular outpouching rising from the basilar tip is favored to represent a thrombosed left P1 PCA. The left posterior communicating artery supplies the left posterior cerebral artery which is otherwise patent. Moderate right distal P2 PCA  stenosis. 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 Carotid Doppler: Unremarkable Recommend 30-day cardiac event monitoring as outpatient 2D Echo: Left ventricular ejection fraction 55 to 60% LDL 93 HgbA1c 5.7 VTE prophylaxis - lovenox No antithrombotic prior to admission, now on aspirin 81 mg daily and clopidogrel 75 mg daily.  Continue aspirin 81 mg daily and Plavix 75 mg daily x 3 weeks, then aspirin 81 mg only.  Therapy recommendations: Outpatient speech therapy recommended Disposition: Home  Hypertension Home meds: Clonidine Long-term BP goal normotensive Patient states she will follow-up with PCP after discharge regarding blood pressure meds.  Hyperlipidemia Home meds: None LDL 93, goal < 70 Add Crestor 20 mg Continue statin at discharge  Other Stroke Risk Factors Advanced Age >/= 4   Other active issues Breast cancer in 2014 status post treatment, currently cancer free.   Patient is OK for discharge from neurology standpoint, with recommendations as above. Follow-up with outpatient neurology in 8 weeks.    Hospital day # 0   Pt seen by Neuro NP/APP and later by MD. Note/plan to be edited by MD as needed.    Otelia Santee, DNP, AGACNP-BC [facility] Please use AMION for pager and EPIC for messaging  ATTENDING NOTE: I reviewed above note and agree with the assessment and plan. Pt was seen and examined.   Husband at bedside.  Patient currently neuro intact.  However she does have multiple symptoms for the last 4 to 5 days with confusion, slurred speech, word finding difficulty, difficulty walking, transient diplopia and right hand weakness.  MRI showed left splenium and right BG small infarct.  However, also showed bilateral BG multifocal chronic lacunar infarcts, overall picture consistent with severe small vessel disease.  MRI showed possible left P1 occlusion and left P-comm supply left PCA, indicating  chronic large vessel disease.  Recommend DAPT for 3 weeks and then aspirin alone.  On Crestor 20, continue on discharge.  Aggressive risk factor modification, BP monitoring at home.  Recommend 30-day CardioNet monitor as outpatient to rule out A-fib.  PT and OT no recommendation.    For detailed assessment and plan, please refer to above/below as I have made changes wherever appropriate.   Neurology will sign off. Please call with questions. Pt will follow up with stroke clinic NP at [facility] in about 4 weeks. Thanks for the consult.   Rosalin Hawking, MD PhD Stroke Neurology 12/30/2022 5:54 PM   To contact Stroke Continuity provider, please refer to [email or web address] After hours, contact General Neurology

## 2022-12-30 NOTE — Discharge Summary (Signed)
[facility] Discharge Summary  Patient name: Cheryl Levy Medical record number: [id number] Date of birth: 1951/06/24 Age: 72 y.o. Gender: female Date of Admission: 12/29/2022  Date of Discharge: 12/30/2022 Admitting Physician: Camelia Phenes, MD  Primary Care Provider: Elisabeth Cara, PA-C Consultants: Neurology  Indication for Hospitalization: CVA  Discharge Diagnoses/Problem List:  Principal Problem for Admission: CVA Other Problems addressed during stay:  Principal Problem:   Stroke [facility]) Active Problems:   HTN (hypertension)   Creatinine elevation   Difficulty walking   History of breast cancer    Brief Hospital Course:  Cheryl Levy is a 72 y.o. female presenting with dizziness, double vision, and confusion found to have acute / subacute stroke.  Stroke Patient presented after transient episode of dizziness, double vision, and confusion. MRI showed hyperintense lesion within the left aspect of the callosal splenium with circumferential restricted diffusion at its periphery and acute/early subacute lacunar infarct within the right lentiform nucleus. No metastasis. MRA showed thrombosed left P1 PCA, moderate right distal P2 PCA stenosis, and L ICA aneurysm. Bilateral carotid US showed 1 - 39% stenosis. For stroke prophylaxis, was started on aspirin, clopidogrel, and switched to high-intensity statin. At discharge patient was back to baseline other than very mild anomic aphasia. Per Neuro, recommend 30 day cardiac monitoring on discharge due to bilateral CVAs.  HTN Systolics in [id number] on admission. By time of discharge BP was stable. Continued on home clonidine while hospitalized and started on amlodipine 44m to achieve normotension.  Creatinine elevation Cr. 1.63 on admission likely in the setting of dehydration. Downtrending and stable during hospital stay.  Patient was continued on home medications for Narcolepsy, Anxiety and GERD.  PCP  follow up: BMP to trend renal function Monitor BP since started on Amlodipine in addition to home meds  Disposition: Home  Discharge Condition: Stable  Discharge Exam:  Vitals:   12/30/22 1000 12/30/22 1235  BP: (!) 175/82 133/69  Pulse: 71 69  Resp: 16 16  Temp: 98.1 F (36.7 C) 97.9 F (36.6 C)  SpO2: 99% 98%   D/c exam per Dr. DCamelia Phenes General: in no acute distress and well-appearing HEENT: normocephalic and atraumatic Respiratory: clear to auscultation bilaterally posteriorly, non-labored breathing, and on RA Extremities: moving all extremities spontaneously Gastrointestinal: non-tender and non-distended Cardiovascular: regular rate Neuro: alert, oriented x 4. No focal neurologic deficits.  Significant Procedures: None  Significant Labs and Imaging:  Recent Labs  Lab 12/29/22 1105 12/30/22 0716  WBC 5.0 4.5  HGB 12.9 13.6  HCT 40.0 41.4  PLT 178 175   Recent Labs  Lab 12/29/22 1105 12/30/22 0716  NA 139 138  K 3.9 3.6  CL 106 103  CO2 29 26  GLUCOSE 130* 108*  BUN 40* 29*  CREATININE 1.63* 1.48*  CALCIUM 8.5* 8.5*    Pertinent Imaging: ECHOCARDIOGRAM COMPLETE Result Date: 12/30/2022  IMPRESSIONS  1. Left ventricular ejection fraction, by estimation, is 55 to 60%. The left ventricle has normal function. The left ventricle has no regional wall motion abnormalities. There is mild concentric left ventricular hypertrophy. Left ventricular diastolic parameters are consistent with Grade I diastolic dysfunction (impaired relaxation).  2. Right ventricular systolic function is normal. The right ventricular size is normal.  3. The mitral valve is normal in structure. Trivial mitral valve regurgitation. No evidence of mitral stenosis.  4. The aortic valve was not well visualized. Aortic valve regurgitation is mild. No aortic stenosis is present.  5. The inferior vena cava  is normal in size with greater than 50% respiratory variability, suggesting right atrial  pressure of 3 mmHg.    VAS US CAROTID Result Date: 12/30/2022 Summary: Right Carotid: Velocities in the right ICA are consistent with a 1-39% stenosis. Left Carotid: Velocities in the left ICA are consistent with a 1-39% stenosis. Vertebrals:  Bilateral vertebral arteries demonstrate antegrade flow. Subclavians: Normal flow hemodynamics were seen in bilateral subclavian   MR ANGIO HEAD WO CONTRAST Result Date: 12/30/2022 IMPRESSION: 1. Approximately 4 x 2 tubular outpouching rising from the basilar tip is favored to represent a thrombosed left P1 PCA. The left posterior communicating artery supplies the left posterior cerebral artery which is otherwise patent. 2. Moderate right distal P2 PCA stenosis. 3. 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  MR BRAIN W CONTRAST Result Date: 12/29/2022 IMPRESSION: The lesion in the left aspect of the splenium of the corpus callosum does not demonstrate significant enhancement and is not compatible with a metastasis.  CT Head Wo Contrast Result Date: 12/29/2022 IMPRESSION: Focal hypodensity along the left lateral aspect of the splenium of the corpus callosum is better assessed on same day brain MRI. No evidence of hemorrhage.  MR BRAIN WO CONTRAST Result Date: 12/29/2022 IMPRESSION: 1. 8 mm T2 FLAIR hyperintense lesion within the left aspect of the callosal splenium with circumferential restricted diffusion at its periphery. This may reflect a subacute infarct. However, post-contrast MR imaging of the brain is recommended to exclude an intracranial metastasis. 2. 3 mm acute/early subacute lacunar infarct within the right lentiform nucleus. 3. 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  Results/Tests Pending at Time of Discharge: None  Discharge Medications:  Allergies as of 12/30/2022       Reactions   Ace Inhibitors Other (See Comments)   Chest  pain        Medication List     STOP taking these medications    amphetamine-dextroamphetamine 10 MG tablet Commonly known as: Adderall   eszopiclone 1 MG Tabs tablet Commonly known as: LUNESTA   metaxalone 800 MG tablet Commonly known as: SKELAXIN   Pf[employer]Bio[employer] COVID-19 Vac-TriS Susp injection Generic drug: COVID-19 mRNA Vac-TriS [employer])       TAKE these medications    amLODipine 2.5 MG tablet Commonly known as: NORVASC Take 1 tablet (2.5 mg total) by mouth daily.   aspirin EC 81 MG tablet Take 1 tablet (81 mg total) by mouth daily. Swallow whole. Start taking on: December 31, 2022   beclomethasone 80 MCG/ACT inhaler Commonly known as: Qvar Inhale 2 puffs into the lungs 2 (two) times daily. What changed:  when to take this reasons to take this   cloNIDine 0.2 MG tablet Commonly known as: CATAPRES Take 0.2 mg by mouth 2 (two) times daily.   clopidogrel 75 MG tablet Commonly known as: PLAVIX Take 1 tablet (75 mg total) by mouth daily for 20 days. Start taking on: December 31, 2022   cyanocobalamin 1000 MCG tablet Take 1 tablet (1,000 mcg total) by mouth daily. Start taking on: December 31, 2022   DULoxetine 60 MG capsule Commonly known as: CYMBALTA Take 120 mg by mouth daily.   Fluticasone-Salmeterol 500-50 MCG/DOSE Aepb Commonly known as: ADVAIR Inhale 1 puff into the lungs 2 (two) times daily as needed (for breathing).   methylphenidate 10 MG tablet Commonly known as: RITALIN Take 20 mg by mouth every evening.   omeprazole 40 MG capsule Commonly known  as: PRILOSEC Take 80 mg by mouth at bedtime.   PARoxetine 20 MG tablet Commonly known as: PAXIL Take 1 tablet (20 mg total) by mouth daily. Start taking on: December 31, 2022   pregabalin 200 MG capsule Commonly known as: LYRICA Take 600 mg by mouth at bedtime.   protriptyline 10 MG tablet Commonly known as: VIVACTIL Take 10 mg by mouth at bedtime.   rosuvastatin 20 MG  tablet Commonly known as: CRESTOR Take 1 tablet (20 mg total) by mouth daily. Start taking on: December 31, 2022        Discharge Instructions: Please refer to Patient Instructions section of EMR for full details.  Patient was counseled important signs and symptoms that should prompt return to medical care, changes in medications, dietary instructions, activity restrictions, and follow up appointments.   Follow-Up Appointments:  Follow-up Information     [place], [place], [place]. Schedule an appointment as soon as possible for a visit in 1 week(s).   Specialty: Family Medicine Why: Make appointment to be seen within one week of discharge from the hospital Contact information: [id number] [place] [address] [phone]                 Colletta Maryland, MD 12/30/2022, 2:06 PM PGY-1, [place]

## 2022-12-30 NOTE — Progress Notes (Signed)
Bilateral carotid ultrasound study completed.   Please see CV Procedures for preliminary results.  Magaly Pollina, RVT  11:05 AM 12/30/22

## 2022-12-30 NOTE — Plan of Care (Signed)
  Problem: Education: Goal: Knowledge of General Education information will improve Description: Including pain rating scale, medication(s)/side effects and non-pharmacologic comfort measures Outcome: Progressing   Problem: Health Behavior/Discharge Planning: Goal: Ability to manage health-related needs will improve Outcome: Progressing   Problem: Clinical Measurements: Goal: Respiratory complications will improve Outcome: Progressing   Problem: Nutrition: Goal: Adequate nutrition will be maintained Outcome: Progressing   Problem: Coping: Goal: Level of anxiety will decrease Outcome: Progressing

## 2022-12-30 NOTE — Discharge Instructions (Addendum)
Dear Cheryl Levy,  Thank you for letting us participate in your care. You were hospitalized for and diagnosed with a Stroke ([place]). You were treated with Asprin and Plavix.   POST-HOSPITAL & CARE INSTRUCTIONS Continue taking Asprin and Plavix daily for 3 weeks then aspirin alone They will mail you a 30 day Heart Monitor  Go to your follow up appointments (listed below)   DOCTOR'S APPOINTMENT   No future appointments.   Take care and be well!  [facility]  [place], [place] [address] [phone]

## 2022-12-31 ENCOUNTER — Other Ambulatory Visit: Payer: Self-pay | Admitting: Cardiology

## 2022-12-31 DIAGNOSIS — I639 Cerebral infarction, unspecified: Secondary | ICD-10-CM

## 2022-12-31 DIAGNOSIS — R42 Dizziness and giddiness: Secondary | ICD-10-CM

## 2022-12-31 NOTE — Progress Notes (Signed)
  30 day event monitor following bilateral CVAs. Dr. Marlou Porch to read.

## 2023-01-14 ENCOUNTER — Ambulatory Visit: Payer: Medicare HMO | Attending: Cardiology

## 2023-01-14 DIAGNOSIS — R42 Dizziness and giddiness: Secondary | ICD-10-CM

## 2023-01-14 DIAGNOSIS — I639 Cerebral infarction, unspecified: Secondary | ICD-10-CM | POA: Diagnosis not present

## 2023-01-15 ENCOUNTER — Telehealth: Payer: Self-pay | Admitting: Cardiology

## 2023-01-15 NOTE — Telephone Encounter (Signed)
Calling to report Critical EKG results.    Monitor ordered by Marlou Porch, pt was rounded in hospital by PA, Lily Kocher

## 2023-01-15 NOTE — Telephone Encounter (Signed)
Monitor report reviewed with Dr Ali Lowe (DOD).  NSVT - no new orders at this time.

## 2023-01-15 NOTE — Telephone Encounter (Signed)
Per [employer] - reports Ventricular Tachycardia on monitor today at 11:21 AM CST.  Underlying rhythm was NSR rate 67 bpm, 9 beat run of VT with rate of 167 bpm.  Monitor placed 01/14/23, last day April 5th.

## 2023-01-15 NOTE — Telephone Encounter (Signed)
Called report was from [employer].   30 day event monitor ordered by Lily Kocher, PA following bilateral CVAs - dizziness.  To be read by Dr Candee Furbish. Attempted to contact pt - no answer.  Left message to call back.

## 2023-01-21 NOTE — Telephone Encounter (Signed)
Dr Marlou Porch reviewed this report and gave no new orders at this time.  Continue to monitor.

## 2023-02-11 ENCOUNTER — Other Ambulatory Visit: Payer: Self-pay

## 2023-02-11 ENCOUNTER — Emergency Department (HOSPITAL_COMMUNITY): Payer: Medicare HMO

## 2023-02-11 ENCOUNTER — Encounter (HOSPITAL_COMMUNITY): Payer: Self-pay | Admitting: Emergency Medicine

## 2023-02-11 ENCOUNTER — Emergency Department (HOSPITAL_COMMUNITY)
Admission: EM | Admit: 2023-02-11 | Discharge: 2023-02-12 | Disposition: A | Discharge status: Home / Self Care | Payer: Medicare HMO | Attending: Emergency Medicine | Admitting: Emergency Medicine

## 2023-02-11 DIAGNOSIS — Z8673 Personal history of transient ischemic attack (TIA), and cerebral infarction without residual deficits: Secondary | ICD-10-CM | POA: Insufficient documentation

## 2023-02-11 DIAGNOSIS — R29898 Other symptoms and signs involving the musculoskeletal system: Secondary | ICD-10-CM | POA: Insufficient documentation

## 2023-02-11 DIAGNOSIS — Z7902 Long term (current) use of antithrombotics/antiplatelets: Secondary | ICD-10-CM | POA: Insufficient documentation

## 2023-02-11 DIAGNOSIS — R9389 Abnormal findings on diagnostic imaging of other specified body structures: Secondary | ICD-10-CM | POA: Diagnosis not present

## 2023-02-11 DIAGNOSIS — R8290 Unspecified abnormal findings in urine: Secondary | ICD-10-CM

## 2023-02-11 DIAGNOSIS — R42 Dizziness and giddiness: Secondary | ICD-10-CM | POA: Diagnosis not present

## 2023-02-11 DIAGNOSIS — R2689 Other abnormalities of gait and mobility: Secondary | ICD-10-CM

## 2023-02-11 DIAGNOSIS — R519 Headache, unspecified: Secondary | ICD-10-CM | POA: Diagnosis present

## 2023-02-11 LAB — CBC
HCT: 37.7 % (ref 36.0–46.0)
Hemoglobin: 12.1 g/dL (ref 12.0–15.0)
MCH: 28.1 pg (ref 26.0–34.0)
MCHC: 32.1 g/dL (ref 30.0–36.0)
MCV: 87.7 fL (ref 80.0–100.0)
Platelets: 150 10*3/uL (ref 150–400)
RBC: 4.3 MIL/uL (ref 3.87–5.11)
RDW: 14.3 % (ref 11.5–15.5)
WBC: 5.6 10*3/uL (ref 4.0–10.5)
nRBC: 0 % (ref 0.0–0.2)

## 2023-02-11 LAB — URINALYSIS, ROUTINE W REFLEX MICROSCOPIC
Bacteria, UA: NONE SEEN
Glucose, UA: NEGATIVE mg/dL
Hgb urine dipstick: NEGATIVE
Ketones, ur: 5 mg/dL — AB
Nitrite: NEGATIVE
Protein, ur: 30 mg/dL — AB
Specific Gravity, Urine: 1.029 (ref 1.005–1.030)
pH: 5 (ref 5.0–8.0)

## 2023-02-11 LAB — COMPREHENSIVE METABOLIC PANEL
ALT: 18 U/L (ref 0–44)
AST: 20 U/L (ref 15–41)
Albumin: 3.2 g/dL — ABNORMAL LOW (ref 3.5–5.0)
Alkaline Phosphatase: 70 U/L (ref 38–126)
Anion gap: 8 (ref 5–15)
BUN: 27 mg/dL — ABNORMAL HIGH (ref 8–23)
CO2: 29 mmol/L (ref 22–32)
Calcium: 8.6 mg/dL — ABNORMAL LOW (ref 8.9–10.3)
Chloride: 103 mmol/L (ref 98–111)
Creatinine, Ser: 1.07 mg/dL — ABNORMAL HIGH (ref 0.44–1.00)
GFR, Estimated: 56 mL/min — ABNORMAL LOW (ref >60)
Glucose, Bld: 105 mg/dL — ABNORMAL HIGH (ref 70–99)
Potassium: 3.8 mmol/L (ref 3.5–5.1)
Sodium: 140 mmol/L (ref 135–145)
Total Bilirubin: 0.6 mg/dL (ref 0.3–1.2)
Total Protein: 6.3 g/dL — ABNORMAL LOW (ref 6.5–8.1)

## 2023-02-11 LAB — CBG MONITORING, ED: Glucose-Capillary: 85 mg/dL (ref 70–99)

## 2023-02-11 MED ORDER — ACETAMINOPHEN 325 MG PO TABS
650.0000 mg | ORAL_TABLET | Freq: Four times a day (QID) | ORAL | Status: DC | PRN
  Administered 2023-02-11: 650 mg via ORAL
  Filled 2023-02-11: qty 2

## 2023-02-11 NOTE — ED Triage Notes (Addendum)
Recent hx of stroke end of February Pt went to bed last night around 10:00pm and was normal. Woke up this morning around 9am and was a little confused. Balance seemed off a few times around noon. She then took BP around 5pm and it was 171/97 and that is when she decided to come get checked out.  She saw neurologist this week and she has taken ritalin for years but been off for weeks due to back order. Has narcolepsy.

## 2023-02-11 NOTE — ED Provider Notes (Signed)
[facility] Emergency Department Provider Note MRN:  [id number]  Arrival date & time: 02/12/23     Chief Complaint   Altered Mental Status   History of Present Illness   Cheryl Levy is a 72 y.o. year-old female presents to the ED with chief complaint of headache, dizziness, and gait instability.  Reports recent stroke in February.  Taking plavix.  Has hx of narcolepsy and had been on ritalin, but is no longer taking this due to her stroke.   States that earlier today she felt off balance and like she was going to fall over.  She had to steady herself with the wall.  She reports mild headache.  She denies numbness, weakness, or tingling.  Family reports that she seemed a bit confused.   Denies recent illness, cough, fever, dysuria.  History provided by patient.   Review of Systems  Pertinent positive and negative review of systems noted in HPI.    Physical Exam   Vitals:   02/12/23 0215 02/12/23 0340  BP: (!) 153/76 (!) 144/80  Pulse: 65 65  Resp: 15 16  Temp:  98.1 F (36.7 C)  SpO2: 98% 98%    CONSTITUTIONAL:  well-appearing, NAD NEURO:  Alert and oriented x 3, CN 3-12 grossly intact, no pronator drift, normal finger to nose, speech is clear EYES:  eyes equal and reactive ENT/NECK:  Supple, no stridor  CARDIO:  normal rate, regular rhythm, appears well-perfused  PULM:  No respiratory distress,  GI/GU:  non-distended,  MSK/SPINE:  No gross deformities, no edema, moves all extremities  SKIN:  no rash, atraumatic   *Additional and/or pertinent findings included in MDM below  Diagnostic and Interventional Summary    EKG Interpretation  Date/Time:  Thursday February 11 2023 19:29:39 EDT Ventricular Rate:  67 PR Interval:  124 QRS Duration: 88 QT Interval:  422 QTC Calculation: 445 R Axis:   6 Text Interpretation: Normal sinus rhythm Septal infarct , age undetermined Abnormal ECG Confirmed by Tilden Fossa [phone]) on 02/11/2023 11:09:29  PM       Labs Reviewed  COMPREHENSIVE METABOLIC PANEL - Abnormal; Notable for the following components:      Result Value   Glucose, Bld 105 (*)    BUN 27 (*)    Creatinine, Ser 1.07 (*)    Calcium 8.6 (*)    Total Protein 6.3 (*)    Albumin 3.2 (*)    GFR, Estimated 56 (*)    All other components within normal limits  URINALYSIS, ROUTINE W REFLEX MICROSCOPIC - Abnormal; Notable for the following components:   Color, Urine AMBER (*)    APPearance HAZY (*)    Bilirubin Urine MODERATE (*)    Ketones, ur 5 (*)    Protein, ur 30 (*)    Leukocytes,Ua MODERATE (*)    All other components within normal limits  CBC - Abnormal; Notable for the following components:   Platelets 149 (*)    All other components within normal limits  CBC  PROTIME-INR  APTT  DIFFERENTIAL  ETHANOL  RAPID URINE DRUG SCREEN, HOSP PERFORMED  CBG MONITORING, ED    MR BRAIN WO CONTRAST  Final Result    CT ANGIO HEAD NECK W WO CM  Final Result      Medications  acetaminophen (TYLENOL) tablet 650 mg (650 mg Oral Given 02/11/23 2332)  cephALEXin (KEFLEX) capsule 500 mg (has no administration in time range)  iohexol (OMNIPAQUE) 350 MG/ML injection 75 mL (75 mLs Intravenous  Contrast Given 02/11/23 2353)     Procedures  /  Critical Care Procedures  ED Course and Medical Decision Making  I have reviewed the triage vital signs, the nursing notes, and pertinent available records from the EMR.  Social Determinants Affecting Complexity of Care: Patient has no clinically significant social determinants affecting this chief complaint..   ED Course:    Medical Decision Making Patient here with dizziness.   Recent stroke.  Given change in symptoms with recent stroke diagnosis, will repeat imaging to rule out evolution of stroke or new stroke. She feels well now and is asymptomatic other than headache.  Patient [facility] her doctor [facility]  I discussed the MRI with the  patient and with our neurologist.      Amount and/or Complexity of Data Reviewed Labs: ordered. Radiology: ordered.  Risk OTC drugs. Prescription drug management.     Consultants: I discussed the case with Dr. Otelia LimesLindzen, who has reviewed the MRI and feels that outpatient follow-up is appropriate.  Continue Plavix.   Treatment and Plan: I considered admission due to patient's initial presentation, but after considering the examination and diagnostic results, patient will not require admission and can be discharged with outpatient follow-up.    Final Clinical Impressions(s) / ED Diagnoses     ICD-10-CM   1. Balance problem  R26.89     2. Abnormal urinalysis  R82.90     3. Abnormal MRI  R93.89       ED Discharge Orders     None         Discharge Instructions Discussed with and Provided to Patient:     Discharge Instructions      Continue taking your Plavix.   Please follow-up with your neurologist.  Our neurologist looked at your scans and felt that you could go home and follow-up, but stressed continuing your Plavix.  Your urine was abnormal, which could have contributed to your symptoms.       Roxy HorsemanBrowning, Lynette Noah, PA-C 02/12/23 [id number]    Tilden Fossaees, Elizabeth, MD 02/12/23 [phone]

## 2023-02-12 ENCOUNTER — Emergency Department (HOSPITAL_COMMUNITY): Payer: Medicare HMO

## 2023-02-12 LAB — DIFFERENTIAL
Abs Immature Granulocytes: 0.01 10*3/uL (ref 0.00–0.07)
Basophils Absolute: 0 10*3/uL (ref 0.0–0.1)
Basophils Relative: 1 %
Eosinophils Absolute: 0.4 10*3/uL (ref 0.0–0.5)
Eosinophils Relative: 8 %
Immature Granulocytes: 0 %
Lymphocytes Relative: 18 %
Lymphs Abs: 1 10*3/uL (ref 0.7–4.0)
Monocytes Absolute: 0.5 10*3/uL (ref 0.1–1.0)
Monocytes Relative: 9 %
Neutro Abs: 3.6 10*3/uL (ref 1.7–7.7)
Neutrophils Relative %: 64 %

## 2023-02-12 LAB — CBC
HCT: 37.4 % (ref 36.0–46.0)
Hemoglobin: 12.1 g/dL (ref 12.0–15.0)
MCH: 28.6 pg (ref 26.0–34.0)
MCHC: 32.4 g/dL (ref 30.0–36.0)
MCV: 88.4 fL (ref 80.0–100.0)
Platelets: 149 10*3/uL — ABNORMAL LOW (ref 150–400)
RBC: 4.23 MIL/uL (ref 3.87–5.11)
RDW: 14.2 % (ref 11.5–15.5)
WBC: 5.6 10*3/uL (ref 4.0–10.5)
nRBC: 0 % (ref 0.0–0.2)

## 2023-02-12 LAB — RAPID URINE DRUG SCREEN, HOSP PERFORMED
Amphetamines: NOT DETECTED
Barbiturates: NOT DETECTED
Benzodiazepines: NOT DETECTED
Cocaine: NOT DETECTED
Opiates: NOT DETECTED
Tetrahydrocannabinol: NOT DETECTED

## 2023-02-12 LAB — PROTIME-INR
INR: 1 (ref 0.8–1.2)
Prothrombin Time: 13.5 seconds (ref 11.4–15.2)

## 2023-02-12 LAB — APTT: aPTT: 30 seconds (ref 24–36)

## 2023-02-12 LAB — ETHANOL: Alcohol, Ethyl (B): <10 mg/dL (ref <10)

## 2023-02-12 MED ORDER — CEPHALEXIN 250 MG PO CAPS
500.0000 mg | ORAL_CAPSULE | Freq: Once | ORAL | Status: AC
  Administered 2023-02-12: 500 mg via ORAL
  Filled 2023-02-12: qty 2

## 2023-02-12 MED ORDER — CEPHALEXIN 500 MG PO CAPS: 500.0000 mg | ORAL_CAPSULE | Freq: Two times a day (BID) | ORAL | 0 refills | Status: DC

## 2023-02-12 MED ORDER — IOHEXOL 350 MG/ML SOLN
75.0000 mL | Freq: Once | INTRAVENOUS | Status: AC | PRN
  Administered 2023-02-11: 75 mL via INTRAVENOUS

## 2023-02-12 NOTE — ED Notes (Signed)
Patient transported to MRI 

## 2023-02-12 NOTE — Discharge Instructions (Signed)
Continue taking your Plavix.   Please follow-up with your neurologist.  Our neurologist looked at your scans and felt that you could go home and follow-up, but stressed continuing your Plavix.  Your urine was abnormal, which could have contributed to your symptoms.

## 2023-02-12 NOTE — ED Notes (Signed)
Called MRI , patient aware could be approx. 6am before scan .

## 2023-02-12 NOTE — ED Notes (Signed)
Transported to MRI

## 2023-02-16 ENCOUNTER — Encounter: Payer: Self-pay | Admitting: Neurology

## 2023-02-16 ENCOUNTER — Ambulatory Visit: Payer: Medicare HMO | Attending: Cardiology | Admitting: Cardiology

## 2023-02-16 NOTE — Progress Notes (Deleted)
Cardiology Office Note:    Date:  02/16/2023   ID:  Cheryl Levy, DOB 1951/07/12, MRN [id number]  PCP:  Cheryl Levy, Cheryl E, PA-Levy   [facility] Providers Cardiologist:  None     Referring MD: Cheryl Levy, Cheryl E, *    History of Present Illness:    Cheryl Levy is a 72 y.o. female here for the evaluation post stroke/hypertension with prior history of stroke referred to neurology previously at the request of Dr. Piedad ClimesFulbright.  Had event monitor placed 30-day and had brief run of nonsustained ventricular tachycardia.  Asymptomatic.  No evidence of atrial fibrillation.  Echocardiogram on 12/30/2022 showed normal pump function no abnormalities  Past Medical History:  Diagnosis Date   Cancer    breast cancer   Hypertension    Thyroid disease     Past Surgical History:  Procedure Laterality Date   ABDOMINAL HYSTERECTOMY     BACK SURGERY     JOINT REPLACEMENT     MASTECTOMY Bilateral    THYROIDECTOMY      Current Medications: No outpatient medications have been marked as taking for the 02/16/23 encounter (Appointment) with Cheryl Levy, Cheryl Albro C, MD.     Allergies:   Ace inhibitors   Social History   Socioeconomic History   Marital status: Married    Spouse name: Not on file   Number of children: Not on file   Years of education: Not on file   Highest education level: Not on file  Occupational History   Not on file  Tobacco Use   Smoking status: Never   Smokeless tobacco: Never  Vaping Use   Vaping Use: Never used  Substance and Sexual Activity   Alcohol use: No   Drug use: No   Sexual activity: Not on file  Other Topics Concern   Not on file  Social History Narrative   Not on file   Social Determinants of Health   Financial Resource Strain: Not on file  Food Insecurity: No Food Insecurity (12/29/2022)   Hunger Vital Sign    Worried About Running Out of Food in the Last Year: Never true    Ran Out of Food in the Last Year: Never true  Transportation  Needs: No Transportation Needs (12/29/2022)   PRAPARE - Administrator, Civil ServiceTransportation    Lack of Transportation (Medical): No    Lack of Transportation (Non-Medical): No  Physical Activity: Not on file  Stress: Not on file  Social Connections: Not on file     Family History: The patient's family history is not on file.  ROS:   Please see the history of present illness.    No fevers chills nausea vomiting syncope bleeding all other systems reviewed and are negative.  EKGs/Labs/Other Studies Reviewed:    The following studies were reviewed today: Cardiac Studies & Procedures       ECHOCARDIOGRAM  ECHOCARDIOGRAM COMPLETE 12/30/2022  Narrative ECHOCARDIOGRAM REPORT    Patient Name:   Cheryl Levy Date of Exam: 12/30/2022 Medical Rec #:  [id number]    Height:       65.0 in Accession #:    [id number]   Weight:       158.5 lb Date of Birth:  1951/07/12    BSA:          1.792 m Patient Age:    72 years     BP:           146/73 mmHg Patient Gender: F  HR:           80 bpm. Exam Location:  Inpatient  Procedure: 2D Echo  Indications:    stroke  History:        Patient has no prior history of Echocardiogram examinations. Stroke; Risk Factors:Hypertension.  Sonographer:    Cheryl Levy Referring Phys: [id number] Cheryl Levy   Sonographer Comments: Technically difficult study due to poor echo windows. Image acquisition challenging due to breast implants and Image acquisition challenging due to mastectomy. IMPRESSIONS   1. Left ventricular ejection fraction, by estimation, is 55 to 60%. The left ventricle has normal function. The left ventricle has no regional wall motion abnormalities. There is mild concentric left ventricular hypertrophy. Left ventricular diastolic parameters are consistent with Grade I diastolic dysfunction (impaired relaxation). 2. Right ventricular systolic function is normal. The right ventricular size is normal. 3. The mitral valve is normal in structure.  Trivial mitral valve regurgitation. No evidence of mitral stenosis. 4. The aortic valve was not well visualized. Aortic valve regurgitation is mild. No aortic stenosis is present. 5. The inferior vena cava is normal in size with greater than 50% respiratory variability, suggesting right atrial pressure of 3 mmHg.  FINDINGS Left Ventricle: Left ventricular ejection fraction, by estimation, is 55 to 60%. The left ventricle has normal function. The left ventricle has no regional wall motion abnormalities. The left ventricular internal cavity size was normal in size. There is mild concentric left ventricular hypertrophy. Left ventricular diastolic parameters are consistent with Grade I diastolic dysfunction (impaired relaxation). Normal left ventricular filling pressure.  Right Ventricle: The right ventricular size is normal. No increase in right ventricular wall thickness. Right ventricular systolic function is normal.  Left Atrium: Left atrial size was normal in size.  Right Atrium: Right atrial size was normal in size.  Pericardium: There is no evidence of pericardial effusion.  Mitral Valve: The mitral valve is normal in structure. Trivial mitral valve regurgitation. No evidence of mitral valve stenosis.  Tricuspid Valve: The tricuspid valve is normal in structure. Tricuspid valve regurgitation is not demonstrated. No evidence of tricuspid stenosis.  Aortic Valve: The aortic valve was not well visualized. Aortic valve regurgitation is mild. Aortic regurgitation PHT measures 504 msec. No aortic stenosis is present. Aortic valve mean gradient measures 3.5 mmHg. Aortic valve peak gradient measures 6.1 mmHg. Aortic valve area, by VTI measures 2.17 cm.  Pulmonic Valve: The pulmonic valve was normal in structure. Pulmonic valve regurgitation is not visualized. No evidence of pulmonic stenosis.  Aorta: The aortic root is normal in size and structure.  Venous: The inferior vena cava is normal in  size with greater than 50% respiratory variability, suggesting right atrial pressure of 3 mmHg.  IAS/Shunts: No atrial level shunt detected by color flow Doppler.   LEFT VENTRICLE PLAX 2D LVIDd:         4.05 cm      Diastology LVIDs:         3.00 cm      LV e' medial:    6.47 cm/s LV PW:         1.10 cm      LV E/e' medial:  8.3 LV IVS:        1.05 cm      LV e' lateral:   8.10 cm/s LVOT diam:     2.00 cm      LV E/e' lateral: 6.6 LV SV:         54 LV SV Index:  30 LVOT Area:     3.14 cm  3D Volume EF: LV Volumes (MOD)            3D EF:        48 % LV vol d, MOD A2C: 67.9 ml  LV EDV:       116 ml LV vol d, MOD A4C: 111.0 ml LV ESV:       60 ml LV vol s, MOD A2C: 34.5 ml  LV SV:        55 ml LV vol s, MOD A4C: 49.8 ml LV SV MOD A2C:     33.4 ml LV SV MOD A4C:     111.0 ml LV SV MOD BP:      51.3 ml  RIGHT VENTRICLE RV Basal diam:  2.40 cm RV Mid diam:    2.20 cm RV S prime:     19.90 cm/s TAPSE (M-mode): 2.1 cm  LEFT ATRIUM             Index        RIGHT ATRIUM           Index LA Vol (A2C):   40.1 ml 22.38 ml/m  RA Area:     11.80 cm LA Vol (A4C):   40.5 ml 22.60 ml/m  RA Volume:   27.50 ml  15.35 ml/m LA Biplane Vol: 40.7 ml 22.71 ml/m AORTIC VALVE                    PULMONIC VALVE AV Area (Vmax):    2.25 cm     PV Vmax:       0.90 m/s AV Area (Vmean):   2.12 cm     PV Peak grad:  3.2 mmHg AV Area (VTI):     2.17 cm AV Vmax:           123.50 cm/s AV Vmean:          84.200 cm/s AV VTI:            0.247 m AV Peak Grad:      6.1 mmHg AV Mean Grad:      3.5 mmHg LVOT Vmax:         88.55 cm/s LVOT Vmean:        56.850 cm/s LVOT VTI:          0.171 m LVOT/AV VTI ratio: 0.69 AI PHT:            504 msec  AORTA Ao Root diam: 3.30 cm Ao Asc diam:  2.90 cm  MITRAL VALVE MV Area (PHT): 2.95 cm    SHUNTS MV Decel Time: 257 msec    Systemic VTI:  0.17 m MR Peak grad: 35.8 mmHg    Systemic Diam: 2.00 cm MR Vmax:      299.00 cm/s MV E velocity: 53.50 cm/s MV A  velocity: 89.10 cm/s MV E/A ratio:  0.60  Chilton Si MD Electronically signed by Chilton Si MD Signature Date/Time: 12/30/2022/1:38:24 PM    Final    MONITORS  CARDIAC EVENT MONITOR 01/27/2023            EKG: 02/11/2023 sinus rhythm 65 bpm poor R wave progression  Recent Labs: 12/29/2022: TSH 0.687 02/11/2023: ALT 18; BUN 27; Creatinine, Ser 1.07; Hemoglobin 12.1; Platelets 149; Potassium 3.8; Sodium 140  Recent Lipid Panel    Component Value Date/Time   CHOL 151 12/29/2022 1828   TRIG 117 12/29/2022 1828   HDL 35 (L)  12/29/2022 1828   CHOLHDL 4.3 12/29/2022 1828   VLDL 23 12/29/2022 1828   LDLCALC 93 12/29/2022 1828     Risk Assessment/Calculations:   {Does this patient have ATRIAL FIBRILLATION?:[phone]}  No BP recorded.  {Refresh Note OR Click here to enter BP  :1}***         Physical Exam:    VS:  There were no vitals taken for this visit.    Wt Readings from Last 3 Encounters:  02/11/23 156 lb (70.8 kg)  12/29/22 158 lb 8 oz (71.9 kg)  04/03/21 160 lb (72.6 kg)     GEN: *** Well nourished, well developed in no acute distress HEENT: Normal NECK: No JVD; No carotid bruits LYMPHATICS: No lymphadenopathy CARDIAC: ***RRR, no murmurs, rubs, gallops RESPIRATORY:  Clear to auscultation without rales, wheezing or rhonchi  ABDOMEN: Soft, non-tender, non-distended MUSCULOSKELETAL:  No edema; No deformity  SKIN: Warm and dry NEUROLOGIC:  Alert and oriented x 3 PSYCHIATRIC:  Normal affect   ASSESSMENT:    No diagnosis found. PLAN:    In order of problems listed above:  Prior stroke  Carotid disease - Mild plaque 1 to 39% on 12/30/2022 in the setting of stroke.  Continue with goal-directed medical therapy  Hypertension - Continue with good blood pressure control  Nonsustained ventricular tachycardia - Brief episode noted on event monitor asymptomatic.  Ejection fraction is normal on echocardiogram as well as wall thickness.  No further  workup necessary.  Asymptomatic.  TSH 0.6.      {Are you ordering a CV Procedure (e.g. stress test, cath, DCCV, TEE, etc)?   Press F2        :[id number]}    Medication Adjustments/Labs and Tests Ordered: Current medicines are reviewed at length with the patient today.  Concerns regarding medicines are outlined above.  No orders of the defined types were placed in this encounter.  No orders of the defined types were placed in this encounter.   There are no Patient Instructions on file for this visit.   Signed, Donato Schultz, MD  02/16/2023 1:09 PM    [facility]

## 2023-03-04 ENCOUNTER — Encounter: Payer: Self-pay | Admitting: *Deleted

## 2023-03-09 NOTE — Progress Notes (Signed)
Initial neurology clinic note  Cheryl Levy MRN: [id number] DOB: 10/14/1951  Referring provider: Iven Finn., MD  Primary care provider: Gillian Scarce  Reason for consult:  stroke follow up  Subjective:  This is Ms. Cheryl Levy, a 72 y.o. right-handed female with a medical history of stroke, HTN, HLD, B12 deficiency, left breast cancer, asthma, GERD, hyperthyroidism, anxiety, depression, narcolepsy, fibromyalgia/chronic pain syndrome, migraine who presents to neurology clinic for stroke follow up. The patient is alone today.  On [birth year] (patient's birthday) patient was walking all over the place (side to side). She felt fine, without imbalance, and even drove home. The next day patient was talking to her daughter and per daughter, she was not making sense when talking. She went to the hospital on 12/27/22 because of this.   Per documentation, patient presented to ED on 12/29/22 for dizziness, double vision, and confusion. CT and MRI were concerning for stroke, though not acute. MRA showed moderate, primarily posterior circulation, stenosis. 30 day heart monitoring was recommended. Holter showed no evidence of afib and was essentially unremarkable.  Patient was started on DAPT with aspirin and plavix. She was told to stop aspirin by other neurologist since mid 01/2023 ([facility]). She is currently on Plavix 75 mg daily. She is also on Crestor 20 mg daily.  Currently patient denies symptoms. She does endorse some more long standing cognitive changes. She states she cannot count or do math in her head. She has difficulty spelling. She has some memory problems which has been going on for > 1 year. She states she had neuropsych testing in the past year at [facility] that she was told was nothing concerning. I do not see these results.  Patient does endorse a very stressful life. She takes care of husband who has had amputations. Her son tried to kill self last year, and now found to have  cancer and CHF at 70 years old  Patient previously took B12 shots, but is not currently on supplementation. She is also not on vit D supplementation.  Patient states her sleep is currently okay. She does not think she snores. She does not feel well rested in the morning. She is tired throughout the day. She does take naps during day. She last had a sleep study over 10 years ago. Those results are not available to me.  Patient has previously been seen in neurology by [facility] (last on 02/08/23). Most recent assessment and plan: IMPRESSION:  Small left and right lacunar CVAs 12/29/22 3 mm aneurysm left ICA. Fibromyalgia Narcolepsy Migraines rare Unable to work due to pain/narcolepsy, poor concentration Low B12  PLAN:  Not taking Ritalin due to strokes Continue Plavix, etc, not aspirin Sunosi 150 mg/d   Smoking: Never EtOH use: Never Drug use: Never Restrictive diet: No  MEDICATIONS:  Outpatient Encounter Medications as of 03/17/2023  Medication Sig Note   amLODipine (NORVASC) 2.5 MG tablet Take 1 tablet (2.5 mg total) by mouth daily.    beclomethasone (QVAR) 80 MCG/ACT inhaler Inhale 2 puffs into the lungs 2 (two) times daily. (Patient taking differently: Inhale 2 puffs into the lungs 2 (two) times daily as needed (for breathing).)    cloNIDine (CATAPRES) 0.2 MG tablet Take 0.2 mg by mouth 2 (two) times daily.    DULoxetine (CYMBALTA) 60 MG capsule Take 120 mg by mouth daily.    Fluticasone-Salmeterol (ADVAIR) 500-50 MCG/DOSE AEPB Inhale 1 puff into the lungs 2 (two) times daily as needed (for breathing).  methylphenidate (RITALIN) 10 MG tablet Take 20 mg by mouth in the morning and at bedtime.    omeprazole (PRILOSEC) 40 MG capsule Take 80 mg by mouth at bedtime.    PARoxetine (PAXIL) 20 MG tablet Take 1 tablet (20 mg total) by mouth daily.    pregabalin (LYRICA) 200 MG capsule Take 600 mg by mouth at bedtime. 12/29/2022: Patient verified she is taking 3 capsules at one time    protriptyline (VIVACTIL) 10 MG tablet Take 10 mg by mouth at bedtime.    rosuvastatin (CRESTOR) 20 MG tablet Take 1 tablet (20 mg total) by mouth daily.    aspirin EC 81 MG tablet Take 1 tablet (81 mg total) by mouth daily. Swallow whole. (Patient not taking: Reported on 03/17/2023)    cephALEXin (KEFLEX) 500 MG capsule Take 1 capsule (500 mg total) by mouth 2 (two) times daily. (Patient not taking: Reported on 03/17/2023)    cyanocobalamin 1000 MCG tablet Take 1 tablet (1,000 mcg total) by mouth daily. (Patient not taking: Reported on 03/17/2023)    No facility-administered encounter medications on file as of 03/17/2023.    PAST MEDICAL HISTORY: Past Medical History:  Diagnosis Date   Cancer [facility])    breast cancer   Hypertension    Thyroid disease     PAST SURGICAL HISTORY: Past Surgical History:  Procedure Laterality Date   ABDOMINAL HYSTERECTOMY     BACK SURGERY     JOINT REPLACEMENT     MASTECTOMY Bilateral    THYROIDECTOMY      ALLERGIES: Allergies  Allergen Reactions   Ace Inhibitors Other (See Comments)    Chest pain     FAMILY HISTORY: History reviewed. No pertinent family history.  SOCIAL HISTORY: Social History   Tobacco Use   Smoking status: Never   Smokeless tobacco: Never  Vaping Use   Vaping Use: Never used  Substance Use Topics   Alcohol use: No   Drug use: No   Social History   Social History Narrative   Are you right handed or left handed? Right   Are you currently employed ?    What is your current occupation? retired   Do you live at home alone? no   Who lives with you? Husband and family   What type of home do you live in: 1 story or 2 story? two    Caffeine none    Objective:  Vital Signs:  BP 126/62   Wt 156 lb (70.8 kg)   BMI 25.18 kg/m   General: General appearance: Awake and alert. No distress. Cooperative with exam.  Skin: No obvious rash or jaundice. HEENT: Atraumatic. Anicteric. Edentulous. Lungs: Non-labored breathing on  room air  Heart: Regular Abdomen: Soft, non tender. Extremities: No edema. No obvious deformity.  Musculoskeletal: No obvious joint swelling. Psych: Affect appropriate.  Neurological: Mental Status: Alert. Speech fluent. No pseudobulbar affect Cognition: 2/3 on delayed recall. Able to name, repeat. Some difficulty repeating a 3 digit number backward. Can say days of week forward and backward. Current events known well. Cranial Nerves: CNII: No RAPD. Visual fields intact. CNIII, IV, VI: PERRL but ?sluggish. No nystagmus. EOMI. CN V: Facial sensation intact bilaterally to fine touch. Masseter clench strong. Jaw jerk is equivocal. CN VII: Facial muscles symmetric and strong. No ptosis at rest. CN VIII: Hears finger rub well bilaterally. CN IX: No hypophonia. CN X: Palate elevates symmetrically. CN XI: Full strength shoulder shrug bilaterally. CN XII: Tongue protrusion full and midline. No atrophy  or fasciculations. No significant dysarthria Motor: Tone is normal. Strength is 5/5 in bilateral upper and lower extremities. Reflexes:  Right Left   Bicep 2+ 2+   Tricep 2+ 2+   BrRad 2+ 2+   Knee 2+ 2+   Ankle 2+ 2+    Pathological Reflexes: Babinski: flexor response bilaterally Hoffman: absent bilaterally Troemner: absent bilaterally Palmomental: absent bilaterally Facial: absent bilaterally Midline tap: absent Sensation: Pinprick: Patchy but may have left face, arm, and leg dullness compared to right Coordination: Intact finger-to- nose-finger with mild dysmetria on left, intact on right. Some difficulty with RAM on left upper extremity. Romberg negative. Gait: Able to rise from chair with arms crossed unassisted. Normal, narrow-based gait. Able to tandem walk. Able to walk on toes and heels.   Labs and Imaging review: Internal labs: Lab Results  Component Value Date   HGBA1C 5.7 (H) 12/29/2022   Lab Results  Component Value Date   VITAMINB12 164 (L) 12/30/2022   Lab  Results  Component Value Date   TSH 0.687 12/29/2022   02/11/23: EtOH < 10 Urine drug screen: negative CBC unremarkable CMP unremarkable  12/30/22: Ammonia 38 (upper limit of normal 35) HIV nonreactive Lipid panel:  Component     Latest Ref Rng 12/29/2022  Cholesterol     0 - 200 mg/dL 454   Triglycerides     <150 mg/dL 098   HDL Cholesterol     >40 mg/dL 35 (L)   Total CHOL/HDL Ratio     RATIO 4.3   VLDL     0 - 40 mg/dL 23   LDL (calc)     0 - 99 mg/dL 93     External labs: CMP (10/22/22) unremarkable Vit D (03/19/22): 25  Imaging: MRI brain wo contrast (02/12/23): FINDINGS: Brain: No acutely restricted diffusion today on DWI. However there is a small new focus of T2 shine through in the left centrum semiovale on series 5, image 87.   Since February abnormal diffusion in the left splenium of the corpus callosum has faded (series 5, image 78), and a smaller area of T2 hyperintense gliosis remains (series 10, image 14).   Extensive T2 heterogeneity in the bilateral basal ganglia again noted, on FLAIR this appears in part due to extensive perivascular spaces. But there is superimposed Patchy and confluent abnormal deep white matter and deep gray matter FLAIR hyperintensity.   Nearby cystic encephalomalacia at the right genu of the corpus callosum is chronic and stable (series 9, image 13).   Similar T2 and FLAIR heterogeneity throughout the pons.   But there is no convincing cerebral cortical encephalomalacia identified, and no chronic cerebral blood products on SWI.   No midline shift, mass effect, evidence of mass lesion, ventriculomegaly, extra-axial collection or acute intracranial hemorrhage. Cervicomedullary junction and pituitary are within normal limits.   Vascular: Major intracranial vascular flow voids are stable since February.   Skull and upper cervical spine: Negative for age visible cervical spine. Visualized bone marrow signal is within  normal limits.   Sinuses/Orbits: Globe motion artifact. Orbits and paranasal sinuses otherwise appear negative.   Other: Trace mastoid air cell fluid appears inconsequential. Grossly normal visible internal auditory structures. Negative visible scalp and face.   IMPRESSION: 1. No acute intracranial abnormality. 2. But a late subacute to early chronic white matter lacunar infarct in the posterior left centrum semiovale is new since February. Expected evolution of left splenium corpus callosum lacunar infarcts since that time. And underlying advanced chronic signal  changes in the white matter (including elsewhere in the corpus callosum, bilateral deep gray matter, and pons. Underlying dilated perivascular spaces in the deep gray nuclei, such that this might be chronic hypertensive encephalopathy/small vessel disease. But other small vessel vasculopathies are not excluded.  CTA head and neck (02/11/23): FINDINGS: CT HEAD FINDINGS   Brain: No evidence of acute infarct, hemorrhage, mass, mass effect, or midline shift. No hydrocephalus or extra-axial fluid collection. Hypodensity in the left splenium of the corpus callosum is more pronounced than on the prior exam, consistent with evolving infarct. Other hypodensities involving the basal ganglia appear unchanged, consistent with remote lacunar infarcts.   Vascular: No hyperdense vessel.   Skull: Negative for fracture or focal lesion.   Sinuses/Orbits: No acute finding.   Other: The mastoid air cells are well aerated.   CTA NECK FINDINGS   Aortic arch: Two-vessel arch with a common origin of the brachiocephalic and left common carotid arteries. Imaged portion shows no evidence of aneurysm or dissection. No significant stenosis of the major arch vessel origins. Aortic atherosclerosis.   Right carotid system: No evidence of dissection, occlusion, or hemodynamically significant stenosis (greater than 50%).   Left carotid  system: No evidence of dissection, occlusion, or hemodynamically significant stenosis (greater than 50%).   Vertebral arteries: No evidence of dissection, occlusion, or hemodynamically significant stenosis (greater than 50%).   Skeleton: No acute osseous abnormality. Degenerative changes in the cervical spine.   Other neck: Heterogeneity and enlargement of the right thyroid lobe, status post left thyroidectomy, with multiple nodules, 1 of which measures up to 2.4 cm. The thyroid was most recently evaluated with ultrasound on 01/31/2020.   Upper chest: No focal pulmonary opacity or pleural effusion.   Review of the MIP images confirms the above findings   CTA HEAD FINDINGS   Anterior circulation: Both internal carotid arteries are patent to the termini, without significant stenosis. 2 mm posteromedially directed outpouching from the right supraclinoid ICA (series 11, image 132 and series 12, image 96). 3 mm laterally directed outpouching from the left supraclinoid ICA (series 11, image 128 and series 12, image 93).   A1 segments patent. Normal anterior communicating artery. Anterior cerebral arteries are patent to their distal aspects without significant stenosis.   No M1 stenosis or occlusion. MCA branches perfused to their distal aspects without significant stenosis.   Posterior circulation: Vertebral arteries patent to the vertebrobasilar junction without significant stenosis. Posterior inferior cerebellar arteries patent proximally.   Basilar patent to its distal aspect without significant stenosis. Again noted is a linear, 3 mm projection extending from the distal basilar artery (series 11, image 118), at the level of the takeoff of the right PCA, favored to represent a thrombosed left P1. Superior cerebellar arteries patent proximally.   Patent right P1. Fetal origin the left PCA from the left posterior communicating artery, which demonstrates mild stenosis at what  was once likely the P1-P2 junction (series 11, image 124). No other significant stenosis in the left PCA. Moderate stenosis in the distal right P2. The right PCA is otherwise perfused to its distal aspect without significant stenosis. The right posterior communicating artery is not visualized.   Venous sinuses: As permitted by contrast timing, patent.   Anatomic variants: Fetal origin of the left PCA.   Review of the MIP images confirms the above findings   IMPRESSION: 1. No acute intracranial process. Evolving infarct in the left splenium of the corpus callosum. 2. No intracranial large vessel occlusion. Moderate stenosis  in the distal right P2. 3. No hemodynamically significant stenosis in the neck. 4. 2 mm posteromedially directed outpouching from the right supraclinoid ICA, and 3 mm laterally directed outpouching from the left supraclinoid ICA, concerning for aneurysms. 5. Linear projection extending from the distal basilar artery, at the level of the takeoff of the right PCA, is favored to represent a thrombosed left P1. 6. Fetal origin of the left PCA, with mild stenosis at what was once likely the P1-P2 junction. 7. Aortic atherosclerosis.  MRA head (12/30/22): FINDINGS: Anterior circulation: Bilateral intracranial ICAs,, MCAs, and ACAs are patent without proximal hemodynamically significant stenosis.   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   Posterior circulation: There is a tubular 4 x 2 mm outpouching arising from the basilar tip anteriorly, which is favored to represent a thrombosed P1 PCA given suggestion of a vessel arising from the tip on reformats and the tubular appearance. The left posterior communicating artery supplies the left posterior cerebral artery which is otherwise patent. Moderate right P2 PCA stenosis. Bilateral intradural vertebral arteries and basilar artery are patent without significant  stenosis.   IMPRESSION: 1. Approximately 4 x 2 tubular outpouching rising from the basilar tip is favored to represent a thrombosed left P1 PCA. The left posterior communicating artery supplies the left posterior cerebral artery which is otherwise patent. 2. Moderate right distal P2 PCA stenosis. 3. 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  MRI brain w/wo contrast (12/29/22 and 12/30/22): FINDINGS: The coronal T2-weighted sequence is moderately motion degraded.   Brain:   No age advanced or lobar predominant parenchymal atrophy.   8 mm T2 FLAIR hyperintense lesion within the left aspect of the callosal splenium with circumferential restricted diffusion at its periphery (series 2, image 30) (series 6, image 21).   3 mm focus of restricted diffusion within the right lentiform nucleus, compatible with an acute/subacute lacunar infarct.   T2 hyperintense foci within the bilateral corona radiata/basal ganglia, likely reflecting a combination of chronic lacunar infarcts and prominent perivascular spaces.   9 mm chronic infarct right aspect of the callosal genu.   Moderate multifocal T2 FLAIR hyperintense signal abnormality elsewhere within the cerebral white matter, nonspecific but compatible with chronic small vessel ischemic disease.   Chronic lacunar infarct within the right thalamus.   Chronic lacunar infarct within the left aspect of the pons. Background moderate pontine chronic small vessel ischemic disease.   Small chronic infarct within the left cerebellar hemisphere.   No chronic intracranial blood products.   No extra-axial fluid collection.   No midline shift.   Vascular: Maintained flow voids within the proximal large arterial vessels.   Skull and upper cervical spine: No focal suspicious marrow lesion.   Sinuses/Orbits: No mass or acute finding within the imaged orbits. No significant paranasal sinus  disease.   IMPRESSION: 1. 8 mm T2 FLAIR hyperintense lesion within the left aspect of the callosal splenium with circumferential restricted diffusion at its periphery. This may reflect a subacute infarct. However, post-contrast MR imaging of the brain is recommended to exclude an intracranial metastasis. 2. 3 mm acute/early subacute lacunar infarct within the right lentiform nucleus. 3. 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  FINDINGS: Postcontrast imaging was obtained to further evaluate the abnormality seen on same day MRI head. The lesion in the left aspect of the splenium of the corpus callosum does not  demonstrate significant enhancement. No pathologic enhancement elsewhere.   IMPRESSION: The lesion in the left aspect of the splenium of the corpus callosum does not demonstrate significant enhancement and is not compatible with a metastasis.  Echo (12/30/22):  1. Left ventricular ejection fraction, by estimation, is 55 to 60%. The  left ventricle has normal function. The left ventricle has no regional  wall motion abnormalities. There is mild concentric left ventricular  hypertrophy. Left ventricular diastolic  parameters are consistent with Grade I diastolic dysfunction (impaired  relaxation).   2. Right ventricular systolic function is normal. The right ventricular  size is normal.   3. The mitral valve is normal in structure. Trivial mitral valve  regurgitation. No evidence of mitral stenosis.   4. The aortic valve was not well visualized. Aortic valve regurgitation  is mild. No aortic stenosis is present.   5. The inferior vena cava is normal in size with greater than 50%  respiratory variability, suggesting right atrial pressure of 3 mmHg.  Left [facility]: Left atrial size was normal in size.   Holter monitor (30 days) 02/18/23: Sinus rhythm average heartbeat 75 bpm   No atrial fibrillation   Brief 9  beat run of nonsustained ventricular tachycardia.  Asymptomatic.   Rare PVCs and PACs.   Assessment/Plan:  Cheryl Levy is a 72 y.o. female who is being seen for history of strokes. She has a relevant medical history of stroke, HTN, HLD, B12 deficiency, left breast cancer, asthma, GERD, hyperthyroidism, anxiety, depression, narcolepsy, fibromyalgia/chronic pain syndrome, migraine. Her neurological examination is pertinent for mild cognitive changes, possible diminished sensation on left (though patchy), and mild dysmetria in LUE. Patient's MRI brain appears consistent with severe chronic microvascular ischemia, however her HTN and HLD seems well controlled. There appears to be more significant changes than would be expected for symptoms. I want to send labs to look for other possible causes including autoimmune/vasculitis screening. I will also be repeating imaging in 6 months to ensure there is not ongoing changes. While cancer and mets are always a possibility, previous imaging did not show this (12/2022).  PLAN: -Blood work: ESR, CRP, ANA, ANCA, B12 -Sleep medicine referral for narcolepsy and possible OSA -Restart B12 supplementation 1000 mcg daily -Vitamin D supplementation 1000 internation units (IU) daily -Continue Plavix 75 mg daily -Continue Crestor 20 mg daily -Will repeat MRI brain w/wo contrast and vessel imaging (CTA) in 6 months -Stroke warning signs discussed and given in AVS  -Return to clinic in 6 months  The impression above as well as the plan as outlined below were extensively discussed with the patient who voiced understanding. All questions were answered to their satisfaction.  When available, results of the above investigations and possible further recommendations will be communicated to the patient via telephone/MyChart. Patient to call office if not contacted after expected testing turnaround time.   Total time spent reviewing records, interview, history/exam,  documentation, and coordination of care on day of encounter:  75 min   Thank you for allowing me to participate in patient's care.  If I can answer any additional questions, I would be pleased to do so.  Jacquelyne Balint, MD   CC: Piedad Climes, [place], PA-C [address] [place] [address]  CC: Referring provider: Iven Finn., MD [address] [place],  [place] [address]

## 2023-03-17 ENCOUNTER — Other Ambulatory Visit (INDEPENDENT_AMBULATORY_CARE_PROVIDER_SITE_OTHER): Payer: Medicare HMO

## 2023-03-17 ENCOUNTER — Encounter: Payer: Self-pay | Admitting: Neurology

## 2023-03-17 ENCOUNTER — Ambulatory Visit: Payer: Medicare HMO | Admitting: Neurology

## 2023-03-17 VITALS — BP 126/62 | Wt 156.0 lb

## 2023-03-17 DIAGNOSIS — E538 Deficiency of other specified B group vitamins: Secondary | ICD-10-CM

## 2023-03-17 DIAGNOSIS — I63332 Cerebral infarction due to thrombosis of left posterior cerebral artery: Secondary | ICD-10-CM

## 2023-03-17 DIAGNOSIS — R413 Other amnesia: Secondary | ICD-10-CM

## 2023-03-17 DIAGNOSIS — G47419 Narcolepsy without cataplexy: Secondary | ICD-10-CM | POA: Diagnosis not present

## 2023-03-17 DIAGNOSIS — E559 Vitamin D deficiency, unspecified: Secondary | ICD-10-CM

## 2023-03-17 LAB — VITAMIN B12: Vitamin B-12: 256 pg/mL (ref 211–911)

## 2023-03-17 LAB — C-REACTIVE PROTEIN: CRP: <1 mg/dL (ref 0.5–20.0)

## 2023-03-17 LAB — SEDIMENTATION RATE: Sed Rate: 23 mm/hr (ref 0–30)

## 2023-03-17 NOTE — Addendum Note (Signed)
Addended by: Lenise Herald on: 03/17/2023 01:52 PM   Modules accepted: Orders

## 2023-03-17 NOTE — Patient Instructions (Addendum)
I saw you today for stroke follow up. While I see changes in the brain and with blood vessels, I want to make sure I understand why you have these changes.  I want to investigate further with the following: -Blood work today -Sleep medicine referral for narcolepsy and possible sleep apnea  I will be in touch when I have the results of your blood work.  We will repeat your brain and vessel imaging in 6 months.  -Restart B12 supplementation 1000 mcg daily -Vitamin D supplementation 1000 internation units (IU) daily -Continue Plavix 75 mg daily -Continue Crestor 20 mg daily  I want to see you back in clinic in 6 months or sooner if needed.  If you have new difficulty speaking, face droop, numbness on one side of the body, weakness on one side of the body, or dizziness/imbalance, this could be the sign of a stroke. Don't wait, please call EMS and be evaluated at the nearest emergency room.  The physicians and staff at [facility] are committed to providing excellent care. You may receive a survey requesting feedback about your experience at our office. We strive to receive "very good" responses to the survey questions. If you feel that your experience would prevent you from giving the office a "very good " response, please contact our office to try to remedy the situation. We may be reached at [phone]. Thank you for taking the time out of your busy day to complete the survey.  Jacquelyne Balint, MD [facility]

## 2023-03-17 NOTE — Addendum Note (Signed)
Addended by: Lenise Herald on: 03/17/2023 01:23 PM   Modules accepted: Orders

## 2023-03-21 LAB — ANA: Anti Nuclear Antibody (ANA): NEGATIVE

## 2023-03-21 LAB — ANCA SCREEN W REFLEX TITER: ANCA SCREEN: NEGATIVE

## 2023-03-30 ENCOUNTER — Emergency Department (HOSPITAL_COMMUNITY): Payer: Medicare HMO

## 2023-03-30 ENCOUNTER — Other Ambulatory Visit: Payer: Self-pay

## 2023-03-30 ENCOUNTER — Inpatient Hospital Stay (HOSPITAL_COMMUNITY)
Admission: EM | Admit: 2023-03-30 | Discharge: 2023-04-01 | DRG: 684 | Disposition: A | Discharge status: Home / Self Care | Payer: Medicare HMO | Attending: Internal Medicine | Admitting: Internal Medicine

## 2023-03-30 ENCOUNTER — Encounter (HOSPITAL_COMMUNITY): Payer: Self-pay

## 2023-03-30 DIAGNOSIS — Z7951 Long term (current) use of inhaled steroids: Secondary | ICD-10-CM | POA: Diagnosis not present

## 2023-03-30 DIAGNOSIS — Z853 Personal history of malignant neoplasm of breast: Secondary | ICD-10-CM

## 2023-03-30 DIAGNOSIS — I671 Cerebral aneurysm, nonruptured: Secondary | ICD-10-CM | POA: Diagnosis present

## 2023-03-30 DIAGNOSIS — G47419 Narcolepsy without cataplexy: Secondary | ICD-10-CM | POA: Diagnosis present

## 2023-03-30 DIAGNOSIS — F32A Depression, unspecified: Secondary | ICD-10-CM | POA: Diagnosis present

## 2023-03-30 DIAGNOSIS — I10 Essential (primary) hypertension: Secondary | ICD-10-CM | POA: Diagnosis present

## 2023-03-30 DIAGNOSIS — I129 Hypertensive chronic kidney disease with stage 1 through stage 4 chronic kidney disease, or unspecified chronic kidney disease: Secondary | ICD-10-CM | POA: Diagnosis present

## 2023-03-30 DIAGNOSIS — Z8673 Personal history of transient ischemic attack (TIA), and cerebral infarction without residual deficits: Secondary | ICD-10-CM | POA: Diagnosis not present

## 2023-03-30 DIAGNOSIS — G8929 Other chronic pain: Secondary | ICD-10-CM | POA: Insufficient documentation

## 2023-03-30 DIAGNOSIS — Z7902 Long term (current) use of antithrombotics/antiplatelets: Secondary | ICD-10-CM | POA: Diagnosis not present

## 2023-03-30 DIAGNOSIS — Z888 Allergy status to other drugs, medicaments and biological substances status: Secondary | ICD-10-CM | POA: Diagnosis not present

## 2023-03-30 DIAGNOSIS — E890 Postprocedural hypothyroidism: Secondary | ICD-10-CM | POA: Diagnosis present

## 2023-03-30 DIAGNOSIS — N1831 Chronic kidney disease, stage 3a: Secondary | ICD-10-CM | POA: Diagnosis present

## 2023-03-30 DIAGNOSIS — N179 Acute kidney failure, unspecified: Principal | ICD-10-CM | POA: Diagnosis present

## 2023-03-30 DIAGNOSIS — Z9013 Acquired absence of bilateral breasts and nipples: Secondary | ICD-10-CM

## 2023-03-30 DIAGNOSIS — Z79899 Other long term (current) drug therapy: Secondary | ICD-10-CM

## 2023-03-30 DIAGNOSIS — R29701 NIHSS score 1: Secondary | ICD-10-CM | POA: Diagnosis present

## 2023-03-30 DIAGNOSIS — E876 Hypokalemia: Secondary | ICD-10-CM | POA: Diagnosis present

## 2023-03-30 DIAGNOSIS — M797 Fibromyalgia: Secondary | ICD-10-CM | POA: Diagnosis present

## 2023-03-30 DIAGNOSIS — F419 Anxiety disorder, unspecified: Secondary | ICD-10-CM | POA: Diagnosis present

## 2023-03-30 DIAGNOSIS — J4530 Mild persistent asthma, uncomplicated: Secondary | ICD-10-CM | POA: Diagnosis present

## 2023-03-30 DIAGNOSIS — I639 Cerebral infarction, unspecified: Secondary | ICD-10-CM

## 2023-03-30 DIAGNOSIS — F418 Other specified anxiety disorders: Secondary | ICD-10-CM | POA: Diagnosis not present

## 2023-03-30 DIAGNOSIS — R2689 Other abnormalities of gait and mobility: Secondary | ICD-10-CM | POA: Diagnosis not present

## 2023-03-30 DIAGNOSIS — Z7982 Long term (current) use of aspirin: Secondary | ICD-10-CM | POA: Diagnosis not present

## 2023-03-30 LAB — I-STAT CHEM 8, ED
BUN: 43 mg/dL — ABNORMAL HIGH (ref 8–23)
Calcium, Ion: 1.17 mmol/L (ref 1.15–1.40)
Chloride: 100 mmol/L (ref 98–111)
Creatinine, Ser: 4.5 mg/dL — ABNORMAL HIGH (ref 0.44–1.00)
Glucose, Bld: 106 mg/dL — ABNORMAL HIGH (ref 70–99)
HCT: 40 % (ref 36.0–46.0)
Hemoglobin: 13.6 g/dL (ref 12.0–15.0)
Potassium: 3.5 mmol/L (ref 3.5–5.1)
Sodium: 140 mmol/L (ref 135–145)
TCO2: 29 mmol/L (ref 22–32)

## 2023-03-30 LAB — COMPREHENSIVE METABOLIC PANEL
ALT: 15 U/L (ref 0–44)
AST: 18 U/L (ref 15–41)
Albumin: 3.5 g/dL (ref 3.5–5.0)
Alkaline Phosphatase: 74 U/L (ref 38–126)
Anion gap: 12 (ref 5–15)
BUN: 46 mg/dL — ABNORMAL HIGH (ref 8–23)
CO2: 25 mmol/L (ref 22–32)
Calcium: 8.9 mg/dL (ref 8.9–10.3)
Chloride: 99 mmol/L (ref 98–111)
Creatinine, Ser: 4.27 mg/dL — ABNORMAL HIGH (ref 0.44–1.00)
GFR, Estimated: 11 mL/min — ABNORMAL LOW (ref >60)
Glucose, Bld: 110 mg/dL — ABNORMAL HIGH (ref 70–99)
Potassium: 3.4 mmol/L — ABNORMAL LOW (ref 3.5–5.1)
Sodium: 136 mmol/L (ref 135–145)
Total Bilirubin: 0.9 mg/dL (ref 0.3–1.2)
Total Protein: 6.5 g/dL (ref 6.5–8.1)

## 2023-03-30 LAB — DIFFERENTIAL
Abs Immature Granulocytes: 0.02 10*3/uL (ref 0.00–0.07)
Basophils Absolute: 0.1 10*3/uL (ref 0.0–0.1)
Basophils Relative: 1 %
Eosinophils Absolute: 0.3 10*3/uL (ref 0.0–0.5)
Eosinophils Relative: 4 %
Immature Granulocytes: 0 %
Lymphocytes Relative: 14 %
Lymphs Abs: 1.1 10*3/uL (ref 0.7–4.0)
Monocytes Absolute: 0.8 10*3/uL (ref 0.1–1.0)
Monocytes Relative: 11 %
Neutro Abs: 5.4 10*3/uL (ref 1.7–7.7)
Neutrophils Relative %: 70 %

## 2023-03-30 LAB — CBC
HCT: 40.7 % (ref 36.0–46.0)
Hemoglobin: 13.2 g/dL (ref 12.0–15.0)
MCH: 28.6 pg (ref 26.0–34.0)
MCHC: 32.4 g/dL (ref 30.0–36.0)
MCV: 88.1 fL (ref 80.0–100.0)
Platelets: 200 10*3/uL (ref 150–400)
RBC: 4.62 MIL/uL (ref 3.87–5.11)
RDW: 13.9 % (ref 11.5–15.5)
WBC: 7.7 10*3/uL (ref 4.0–10.5)
nRBC: 0 % (ref 0.0–0.2)

## 2023-03-30 LAB — PROTIME-INR
INR: 1 (ref 0.8–1.2)
Prothrombin Time: 13.6 seconds (ref 11.4–15.2)

## 2023-03-30 LAB — ETHANOL: Alcohol, Ethyl (B): <10 mg/dL (ref <10)

## 2023-03-30 LAB — APTT: aPTT: 29 seconds (ref 24–36)

## 2023-03-30 MED ORDER — BUDESONIDE 0.25 MG/2ML IN SUSP
0.2500 mg | Freq: Two times a day (BID) | RESPIRATORY_TRACT | Status: DC
  Filled 2023-03-30 (×2): qty 2

## 2023-03-30 MED ORDER — ACETAMINOPHEN 325 MG PO TABS
650.0000 mg | ORAL_TABLET | Freq: Four times a day (QID) | ORAL | Status: DC | PRN
  Administered 2023-03-30: 650 mg via ORAL
  Filled 2023-03-30: qty 2

## 2023-03-30 MED ORDER — PREGABALIN 75 MG PO CAPS
150.0000 mg | ORAL_CAPSULE | Freq: Every day | ORAL | Status: DC
  Administered 2023-03-30 – 2023-03-31 (×2): 150 mg via ORAL
  Filled 2023-03-30 (×2): qty 2

## 2023-03-30 MED ORDER — OXYCODONE HCL 5 MG PO TABS: 5.0000 mg | ORAL_TABLET | ORAL | Status: DC | PRN

## 2023-03-30 MED ORDER — SODIUM CHLORIDE 0.9 % IV SOLN: INTRAVENOUS | Status: DC

## 2023-03-30 MED ORDER — SODIUM CHLORIDE 0.9 % IV BOLUS
1000.0000 mL | Freq: Once | INTRAVENOUS | Status: AC
  Administered 2023-03-30: 1000 mL via INTRAVENOUS

## 2023-03-30 MED ORDER — GADOBUTROL 1 MMOL/ML IV SOLN
6.0000 mL | Freq: Once | INTRAVENOUS | Status: AC | PRN
  Administered 2023-03-30: 6 mL via INTRAVENOUS

## 2023-03-30 MED ORDER — ROSUVASTATIN CALCIUM 5 MG PO TABS
10.0000 mg | ORAL_TABLET | Freq: Every day | ORAL | Status: DC
  Administered 2023-03-31 – 2023-04-01 (×2): 10 mg via ORAL
  Filled 2023-03-30 (×2): qty 2

## 2023-03-30 MED ORDER — CLOPIDOGREL BISULFATE 75 MG PO TABS
75.0000 mg | ORAL_TABLET | Freq: Every day | ORAL | Status: DC
  Administered 2023-03-31 – 2023-04-01 (×2): 75 mg via ORAL
  Filled 2023-03-30 (×2): qty 1

## 2023-03-30 MED ORDER — SODIUM CHLORIDE 0.9% FLUSH
3.0000 mL | Freq: Two times a day (BID) | INTRAVENOUS | Status: DC
  Administered 2023-03-30 – 2023-04-01 (×4): 3 mL via INTRAVENOUS

## 2023-03-30 MED ORDER — SODIUM CHLORIDE 0.9% FLUSH: 3.0000 mL | Freq: Once | INTRAVENOUS | Status: DC

## 2023-03-30 MED ORDER — HEPARIN SODIUM (PORCINE) 5000 UNIT/ML IJ SOLN
5000.0000 [IU] | Freq: Three times a day (TID) | INTRAMUSCULAR | Status: DC
  Administered 2023-03-31 – 2023-04-01 (×4): 5000 [IU] via SUBCUTANEOUS
  Filled 2023-03-30 (×4): qty 1

## 2023-03-30 MED ORDER — SODIUM CHLORIDE 0.9 % IV SOLN: INTRAVENOUS | Status: AC

## 2023-03-30 MED ORDER — ALBUTEROL SULFATE (2.5 MG/3ML) 0.083% IN NEBU: 2.5000 mg | INHALATION_SOLUTION | Freq: Four times a day (QID) | RESPIRATORY_TRACT | Status: DC | PRN

## 2023-03-30 NOTE — H&P (Signed)
History and Physical    Cheryl Levy ZOX:[id number] DOB: Mar 24, 1951 DOA: 03/30/2023  PCP: Burnis Medin, PA-C   Patient coming from: Home   Chief Complaint: Balance difficulty   HPI: Cheryl Levy is a 72 y.o. female with medical history significant for hypertension, depression, anxiety, fibromyalgia, history of CVA, and narcolepsy presents to the emergency department with balance difficulty.  Patient reports a history of recurrent imbalance since her CVA in February 2024.  This has been significantly worse over the past couple days.  She feels lightheaded when standing, but is asymptomatic while at rest.  She reports sleeping more recently since the stimulants that she was taking for her narcolepsy were discontinued.  She has also had a loss of appetite and was not eating or drinking much of anything for the past couple days.  She denies abdominal pain, nausea, vomiting, or diarrhea.  ED Course: Upon arrival to the ED, patient is found to be afebrile and saturating well on room air with normal heart rate and stable blood pressure.  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  She was given a liter of normal saline in the ED.  Review of Systems:  All other systems reviewed and apart from HPI, are negative.  Past Medical History:  Diagnosis Date   Cancer [facility])    breast cancer   Hypertension    Thyroid disease     Past Surgical History:  Procedure Laterality Date   ABDOMINAL HYSTERECTOMY     BACK SURGERY     JOINT REPLACEMENT     MASTECTOMY Bilateral    THYROIDECTOMY      Social History:   reports that she has never smoked. She has never used smokeless tobacco. She reports that she does not drink alcohol and does not use drugs.  Allergies  Allergen Reactions   Ace Inhibitors Other (See Comments)    Chest pain     History  reviewed. No pertinent family history.   Prior to Admission medications   Medication Sig Start Date End Date Taking? Authorizing Provider  clopidogrel (PLAVIX) 75 MG tablet Take 75 mg by mouth daily.   Yes Hill, Manus Gunning, MD  amLODipine (NORVASC) 2.5 MG tablet Take 1 tablet (2.5 mg total) by mouth daily. 12/30/22   Levin Erp, MD  aspirin EC 81 MG tablet Take 1 tablet (81 mg total) by mouth daily. Swallow whole. Patient not taking: Reported on 03/17/2023 12/31/22   Levin Erp, MD  beclomethasone (QVAR) 80 MCG/ACT inhaler Inhale 2 puffs into the lungs 2 (two) times daily. Patient taking differently: Inhale 2 puffs into the lungs 2 (two) times daily as needed (for breathing). 05/23/14   Benjiman Core, MD  cephALEXin (KEFLEX) 500 MG capsule Take 1 capsule (500 mg total) by mouth 2 (two) times daily. Patient not taking: Reported on 03/17/2023 02/12/23   Roxy Horseman, PA-C  cloNIDine (CATAPRES) 0.2 MG tablet Take 0.2 mg by mouth 2 (two) times daily.    [provider]  cyanocobalamin 1000 MCG tablet Take 1 tablet (1,000 mcg total) by mouth daily. Patient not taking: Reported on 03/17/2023 12/31/22   Levin Erp, MD  DULoxetine (CYMBALTA) 60 MG capsule Take 120 mg by mouth daily. 02/03/17   [provider]  Fluticasone-Salmeterol (ADVAIR) 500-50 MCG/DOSE AEPB Inhale 1 puff into the lungs 2 (two) times daily as needed (for breathing).  [provider]  methylphenidate (RITALIN) 10 MG tablet Take 20 mg by mouth in the morning and at bedtime.    [provider]  omeprazole (PRILOSEC) 40 MG capsule Take 80 mg by mouth at bedtime.    [provider]  PARoxetine (PAXIL) 20 MG tablet Take 1 tablet (20 mg total) by mouth daily. 12/31/22   Levin Erp, MD  pregabalin (LYRICA) 200 MG capsule Take 600 mg by mouth at bedtime.    [provider]  protriptyline (VIVACTIL) 10 MG tablet Take 10 mg by mouth at bedtime. 05/19/17   [provider]  rosuvastatin (CRESTOR) 20 MG tablet Take 1 tablet (20 mg total) by mouth daily. 12/31/22   Levin Erp, MD    Physical Exam: Vitals:   03/30/23 1742 03/30/23 1900 03/30/23 2044 03/30/23 2114  BP:  (!) 115/57 118/85 (!) 142/60  Pulse:  61 67 70  Resp:  17 16 18   Temp: (!) 97.5 F (36.4 C)  97.9 F (36.6 C) (!) 97.5 F (36.4 C)  TempSrc: Oral  Oral Oral  SpO2:  96% 97% (!) 84%  Weight:      Height:        Constitutional: NAD, calm  Eyes: PERTLA, lids and conjunctivae normal ENMT: Mucous membranes are moist. Posterior pharynx clear of any exudate or lesions.   Neck: supple, no masses  Respiratory: no wheezing, no crackles. No accessory muscle use.  Cardiovascular: S1 & S2 heard, regular rate and rhythm. No extremity edema.  Abdomen: No distension, no tenderness, soft. Bowel sounds active.  Musculoskeletal: no clubbing / cyanosis. No joint deformity upper and lower extremities.   Skin: no significant rashes, lesions, ulcers. Warm, dry, well-perfused. Neurologic: CN 2-12 grossly intact. Moving all extremities. Alert and oriented.  Psychiatric: Pleasant. Cooperative.    Labs and Imaging on Admission: I have personally reviewed following labs and imaging studies  CBC: Recent Labs  Lab 03/30/23 1712 03/30/23 1719  WBC 7.7  --   NEUTROABS 5.4  --   HGB 13.2 13.6  HCT 40.7 40.0  MCV 88.1  --   PLT 200  --    Basic Metabolic Panel: Recent Labs  Lab 03/30/23 1712 03/30/23 1719  NA 136 140  K 3.4* 3.5  CL 99 100  CO2 25  --   GLUCOSE 110* 106*  BUN 46* 43*  CREATININE 4.27* 4.50*  CALCIUM 8.9  --    GFR: Estimated Creatinine Clearance: 10.9 mL/min (A) (by C-G formula based on SCr of 4.5 mg/dL (H)). Liver Function Tests: Recent Labs  Lab 03/30/23 1712  AST 18  ALT 15  ALKPHOS 74  BILITOT 0.9  PROT 6.5  ALBUMIN 3.5   No results for input(s): "LIPASE", "AMYLASE" in the last 168 hours. No results for input(s): "AMMONIA" in the last 168  hours. Coagulation Profile: Recent Labs  Lab 03/30/23 1712  INR 1.0   Cardiac Enzymes: No results for input(s): "CKTOTAL", "CKMB", "CKMBINDEX", "TROPONINI" in the last 168 hours. BNP (last 3 results) No results for input(s): "PROBNP" in the last 8760 hours. HbA1C: No results for input(s): "HGBA1C" in the last 72 hours. CBG: No results for input(s): "GLUCAP" in the last 168 hours. Lipid Profile: No results for input(s): "CHOL", "HDL", "LDLCALC", "TRIG", "CHOLHDL", "LDLDIRECT" in the last 72 hours. Thyroid Function Tests: No results for input(s): "TSH", "T4TOTAL", "FREET4", "T3FREE", "THYROIDAB" in the last 72 hours. Anemia Panel: No results for input(s): "VITAMINB12", "FOLATE", "FERRITIN", "TIBC", "IRON", "RETICCTPCT" in the last 72  hours. Urine analysis:    Component Value Date/Time   COLORURINE AMBER (A) 02/11/2023 2008   APPEARANCEUR HAZY (A) 02/11/2023 2008   LABSPEC 1.029 02/11/2023 2008   PHURINE 5.0 02/11/2023 2008   GLUCOSEU NEGATIVE 02/11/2023 2008   HGBUR NEGATIVE 02/11/2023 2008   BILIRUBINUR MODERATE (A) 02/11/2023 2008   KETONESUR 5 (A) 02/11/2023 2008   PROTEINUR 30 (A) 02/11/2023 2008   NITRITE NEGATIVE 02/11/2023 2008   LEUKOCYTESUR MODERATE (A) 02/11/2023 2008   Sepsis Labs: @LABRCNTIP (procalcitonin:4,lacticidven:4) )No results found for this or any previous visit (from the past 240 hour(s)).   Radiological Exams on Admission: MR Brain Wo Contrast (neuro protocol)  Result Date: 03/30/2023 CLINICAL DATA:  Acute neurologic deficit EXAM: MRI HEAD WITHOUT CONTRAST MRA HEAD WITHOUT CONTRAST MRA NECK WITHOUT AND WITH CONTRAST TECHNIQUE: Multiplanar, multi-echo pulse sequences of the brain and surrounding structures were acquired without intravenous contrast. Angiographic images of the Circle of Willis were acquired using MRA technique without intravenous contrast. Angiographic images of the neck were acquired using MRA technique without and with intravenous  contrast. Carotid stenosis measurements (when applicable) are obtained utilizing NASCET criteria, using the distal internal carotid diameter as the denominator. CONTRAST:  6mL GADAVIST GADOBUTROL 1 MMOL/ML IV SOLN COMPARISON:  12/30/2022 FINDINGS: MRI HEAD FINDINGS Brain: No acute infarct, mass effect or extra-axial collection. No acute or chronic hemorrhage. There is multifocal hyperintense T2-weighted signal within the white matter. Parenchymal volume and CSF spaces are normal. The midline structures are normal. Vascular: Major flow voids are preserved. Skull and upper cervical spine: Normal calvarium and skull base. Visualized upper cervical spine and soft tissues are normal. Sinuses/Orbits:No paranasal sinus fluid levels or advanced mucosal thickening. No mastoid or middle ear effusion. Normal orbits. MRA HEAD FINDINGS POSTERIOR CIRCULATION: --Vertebral arteries: Normal --Inferior cerebellar arteries: Normal. 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 Otherwise normal. --Superior cerebellar arteries: Normal. --Posterior cerebral arteries: Normal. The left PCA is predominantly supplied by the posterior communicating artery. ANTERIOR CIRCULATION: --Intracranial internal carotid arteries: Unchanged laterally projecting 3 mm aneurysm arising from the clinoid segment of the left ICA. Normal right ICA. --Anterior cerebral arteries (ACA): Normal. --Middle cerebral arteries (MCA): Normal. MRA NECK FINDINGS Aortic arch: Normal 3 vessel pattern Right carotid system: Normal Left carotid system: Normal Vertebral arteries: Codominant and normal Other: None IMPRESSION: 1. No acute intracranial abnormality. 2. 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 3. 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 4. Mild chronic small vessel ischemic disease. Electronically Signed   By: Deatra Robinson M.D.   On: 03/30/2023 21:03   MR ANGIO HEAD WO CONTRAST  Result Date: 03/30/2023 CLINICAL DATA:  Acute neurologic deficit EXAM: MRI HEAD WITHOUT CONTRAST MRA HEAD WITHOUT CONTRAST MRA NECK WITHOUT AND WITH CONTRAST TECHNIQUE: Multiplanar, multi-echo pulse sequences of the brain and surrounding structures were acquired without intravenous contrast. Angiographic images of the Circle of Willis were acquired using MRA technique without intravenous contrast. Angiographic images of the neck were acquired using MRA technique without and with intravenous contrast. Carotid stenosis measurements (when applicable) are obtained utilizing NASCET criteria, using the distal internal carotid diameter as the denominator. CONTRAST:  6mL GADAVIST GADOBUTROL 1 MMOL/ML IV SOLN COMPARISON:  12/30/2022 FINDINGS: MRI HEAD FINDINGS Brain: No acute infarct, mass effect or extra-axial collection. No acute or chronic hemorrhage. There is multifocal hyperintense T2-weighted signal within the white matter. Parenchymal  volume and CSF spaces are normal. The midline structures are normal. Vascular: Major flow voids are preserved. Skull and upper cervical spine: Normal calvarium and skull base. Visualized upper cervical spine and soft tissues are normal. Sinuses/Orbits:No paranasal sinus fluid levels or advanced mucosal thickening. No mastoid or middle ear effusion. Normal orbits. MRA HEAD FINDINGS POSTERIOR CIRCULATION: --Vertebral arteries: Normal --Inferior cerebellar arteries: Normal. 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 Otherwise normal. --Superior cerebellar arteries: Normal. --Posterior cerebral arteries: Normal. The left PCA is predominantly supplied by the posterior communicating artery. ANTERIOR CIRCULATION: --Intracranial internal carotid arteries: Unchanged laterally projecting 3 mm aneurysm arising from the clinoid segment of the left ICA. Normal right ICA. --Anterior  cerebral arteries (ACA): Normal. --Middle cerebral arteries (MCA): Normal. MRA NECK FINDINGS Aortic arch: Normal 3 vessel pattern Right carotid system: Normal Left carotid system: Normal Vertebral arteries: Codominant and normal Other: None IMPRESSION: 1. No acute intracranial abnormality. 2. 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 3. 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 4. Mild chronic small vessel ischemic disease. Electronically Signed   By: Deatra Robinson M.D.   On: 03/30/2023 21:03   MR Angiogram Neck W or Wo Contrast  Result Date: 03/30/2023 CLINICAL DATA:  Acute neurologic deficit EXAM: MRI HEAD WITHOUT CONTRAST MRA HEAD WITHOUT CONTRAST MRA NECK WITHOUT AND WITH CONTRAST TECHNIQUE: Multiplanar, multi-echo pulse sequences of the brain and surrounding structures were acquired without intravenous contrast. Angiographic images of the Circle of Willis were acquired using MRA technique without intravenous contrast. Angiographic images of the neck were acquired using MRA technique without and with intravenous contrast. Carotid stenosis measurements (when applicable) are obtained utilizing NASCET criteria, using the distal internal carotid diameter as the denominator. CONTRAST:  6mL GADAVIST GADOBUTROL 1 MMOL/ML IV SOLN COMPARISON:  12/30/2022 FINDINGS: MRI HEAD FINDINGS Brain: No acute infarct, mass effect or extra-axial collection. No acute or chronic hemorrhage. There is multifocal hyperintense T2-weighted signal within the white matter. Parenchymal volume and CSF spaces are normal. The midline structures are normal. Vascular: Major flow voids are preserved. Skull and upper cervical spine: Normal calvarium and skull base. Visualized upper cervical spine and soft tissues are normal. Sinuses/Orbits:No paranasal sinus fluid levels or advanced mucosal thickening. No mastoid or middle ear effusion. Normal orbits.  MRA HEAD FINDINGS POSTERIOR CIRCULATION: --Vertebral arteries: Normal --Inferior cerebellar arteries: Normal. 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 Otherwise normal. --Superior cerebellar arteries: Normal. --Posterior cerebral arteries: Normal. The left PCA is predominantly supplied by the posterior communicating artery. ANTERIOR CIRCULATION: --Intracranial internal carotid arteries: Unchanged laterally projecting 3 mm aneurysm arising from the clinoid segment of the left ICA. Normal right ICA. --Anterior cerebral arteries (ACA): Normal. --Middle cerebral arteries (MCA): Normal. MRA NECK FINDINGS Aortic arch: Normal 3 vessel pattern Right carotid system: Normal Left carotid system: Normal Vertebral arteries: Codominant and normal Other: None IMPRESSION: 1. No acute intracranial abnormality. 2. 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 3. 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 4. Mild chronic small vessel ischemic disease. Electronically Signed   By: Deatra Robinson M.D.   On: 03/30/2023 21:03   CT HEAD WO CONTRAST  Result Date: 03/30/2023 CLINICAL DATA:  Dizziness and slurred speech. EXAM: CT HEAD WITHOUT CONTRAST TECHNIQUE: Contiguous  axial images were obtained from the base of the skull through the vertex without intravenous contrast. RADIATION DOSE REDUCTION: This exam was performed according to the departmental dose-optimization program which includes automated exposure control, adjustment of the mA and/or kV according to patient size and/or use of iterative reconstruction technique. COMPARISON:  February 11, 2023 FINDINGS: Brain: There is mild cerebral atrophy with widening of the extra-axial spaces and ventricular dilatation. There are areas of decreased attenuation within the white matter tracts of the supratentorial brain, consistent with microvascular  disease changes. Multiple small bilateral chronic basal ganglia lacunar infarcts are noted. Vascular: No hyperdense vessel or unexpected calcification. Skull: Normal. Negative for fracture or focal lesion. Sinuses/Orbits: No acute finding. Other: None. IMPRESSION: 1. No acute intracranial abnormality. 2. Generalized cerebral atrophy and microvascular disease changes of the supratentorial brain. 3. Multiple small bilateral chronic basal ganglia lacunar infarcts. Electronically Signed   By: Aram Candela M.D.   On: 03/30/2023 17:28    EKG: Independently reviewed. Sinus rhyth,   Assessment/Plan   1. 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 Imbalance  - This has been a recurring issue for her since the CVA in February 2024, worse over the past 2 days - No acute findings noted on MRI brain or MRA head & neck in ED  - Likely d/t volume-depletion on background of cerebrovascular disease; high-dose Lyrica use could also be contributing, particularly with marked reduction in her GFR  - Renally-dose Lyrica, continue IVF hydration, consult PT if fails to improve with this   3. Hx of CVA  - Continue Crestor with renal-dosing, continue Plavix    4. Asthma  - Not in exacerbation on admission  - Continue ICS, as-needed albuterol    5. Depression, anxiety  - Hold Cymbalta and Paxil until renal function improves     6. HTN  - BP low-normal, plan to hold antihypertensives for now to promote renal perfusion in setting of prerenal azotemia    DVT prophylaxis: sq heparin  Code Status: Full  Level of Care: Level of care: Telemetry Medical Family Communication: none present  Disposition Plan:  Patient is from: home  Anticipated d/c is to: Home  Anticipated d/c date is:  04/02/23 Patient currently: Pending improved renal function  Consults called: none  Admission status: Inpatient     Briscoe Deutscher, MD [facility]  03/30/2023, 11:01 PM

## 2023-03-30 NOTE — ED Provider Notes (Signed)
[place] [facility] DEPARTMENT AT [facility] Provider Note   CSN: [id number] Arrival date & time: 03/30/23  1647     History  Chief Complaint  Patient presents with   Dizziness    Cheryl Levy is a 72 y.o. female.  Patient with a history of known stroke patient was admitted February 6 20th through February 21 for stroke also with hypertension elevated creatinine difficulty walking history of breast cancer.  Patient presented with transient episodes of dizziness double vision confusion MRI showed hyperintense lesion in the left aspect of the callosal splenium and circumferential restricted diffusion at its periphery and acute early subacute lacunar infarct within the right lentiform nucleus no metastasis MRA showed left thrombosed P1 PCA moderate right distal P2 PCA stenosis and left ICA aneurysm bilateral carotid ultrasound showed 39% stenosis at most.  Stroke prophylaxis was started on aspirin and Plavix and patient started on high intensity statin.  Patient presenting today with the onset of symptoms from yesterday confusion and being off balance dizziness denied any visual problems denied any upper extremity or lower extremity weakness stated there and her gait was markedly abnormal.  Also patient states that she has not been eating or drinking very well because she has narcolepsy.  And used to be on Ritalin but they stopped that because of the history of strokes.  She has been sleeping a lot and therefore not eating or drinking says she does not have an appetite.  Patient is alert here and answering all questions.       Home Medications Prior to Admission medications   Medication Sig Start Date End Date Taking? Authorizing Provider  amLODipine (NORVASC) 2.5 MG tablet Take 1 tablet (2.5 mg total) by mouth daily. 12/30/22   Levin Erp, MD  aspirin EC 81 MG tablet Take 1 tablet (81 mg total) by mouth daily. Swallow whole. Patient not taking: Reported on 03/17/2023  12/31/22   Levin Erp, MD  beclomethasone (QVAR) 80 MCG/ACT inhaler Inhale 2 puffs into the lungs 2 (two) times daily. Patient taking differently: Inhale 2 puffs into the lungs 2 (two) times daily as needed (for breathing). 05/23/14   Benjiman Core, MD  cephALEXin (KEFLEX) 500 MG capsule Take 1 capsule (500 mg total) by mouth 2 (two) times daily. Patient not taking: Reported on 03/17/2023 02/12/23   Roxy Horseman, PA-C  cloNIDine (CATAPRES) 0.2 MG tablet Take 0.2 mg by mouth 2 (two) times daily.    [provider]  cyanocobalamin 1000 MCG tablet Take 1 tablet (1,000 mcg total) by mouth daily. Patient not taking: Reported on 03/17/2023 12/31/22   Levin Erp, MD  DULoxetine (CYMBALTA) 60 MG capsule Take 120 mg by mouth daily. 02/03/17   [provider]  Fluticasone-Salmeterol (ADVAIR) 500-50 MCG/DOSE AEPB Inhale 1 puff into the lungs 2 (two) times daily as needed (for breathing).    [provider]  methylphenidate (RITALIN) 10 MG tablet Take 20 mg by mouth in the morning and at bedtime.    [provider]  omeprazole (PRILOSEC) 40 MG capsule Take 80 mg by mouth at bedtime.    [provider]  PARoxetine (PAXIL) 20 MG tablet Take 1 tablet (20 mg total) by mouth daily. 12/31/22   Levin Erp, MD  pregabalin (LYRICA) 200 MG capsule Take 600 mg by mouth at bedtime.    [provider]  protriptyline (VIVACTIL) 10 MG tablet Take 10 mg by mouth at bedtime. 05/19/17   [provider]  rosuvastatin (CRESTOR) 20 MG  tablet Take 1 tablet (20 mg total) by mouth daily. 12/31/22   Levin Erp, MD      Allergies    Ace inhibitors    Review of Systems   Review of Systems  Constitutional:  Positive for appetite change. Negative for chills and fever.  HENT:  Negative for ear pain and sore throat.   Eyes:  Negative for pain and visual disturbance.  Respiratory:  Negative for cough and shortness of breath.   Cardiovascular:  Negative  for chest pain and palpitations.  Gastrointestinal:  Negative for abdominal pain and vomiting.  Genitourinary:  Negative for dysuria and hematuria.  Musculoskeletal:  Negative for arthralgias and back pain.  Skin:  Negative for color change and rash.  Neurological:  Positive for dizziness and headaches. Negative for seizures, syncope, speech difficulty, weakness and numbness.  Psychiatric/Behavioral:  Positive for confusion.   All other systems reviewed and are negative.   Physical Exam Updated Vital Signs BP 118/85   Pulse 67   Temp 97.9 F (36.6 C) (Oral)   Resp 16   Ht 1.676 m (5\' 6" )   Wt 68 kg   SpO2 97%   BMI 24.21 kg/m  Physical Exam Neurological:     General: No focal deficit present.     Mental Status: She is oriented to person, place, and time.     Cranial Nerves: No cranial nerve deficit.     Sensory: No sensory deficit.     Motor: No weakness.     Coordination: Coordination normal.     Comments: Patient's gait not tested.     ED Results / Procedures / Treatments   Labs (all labs ordered are listed, but only abnormal results are displayed) Labs Reviewed  COMPREHENSIVE METABOLIC PANEL - Abnormal; Notable for the following components:      Result Value   Potassium 3.4 (*)    Glucose, Bld 110 (*)    BUN 46 (*)    Creatinine, Ser 4.27 (*)    GFR, Estimated 11 (*)    All other components within normal limits  I-STAT CHEM 8, ED - Abnormal; Notable for the following components:   BUN 43 (*)    Creatinine, Ser 4.50 (*)    Glucose, Bld 106 (*)    All other components within normal limits  PROTIME-INR  APTT  CBC  DIFFERENTIAL  ETHANOL  CBG MONITORING, ED    EKG EKG Interpretation  Date/Time:  Tuesday Mar 30 2023 16:56:38 EDT Ventricular Rate:  75 PR Interval:  126 QRS Duration: 90 QT Interval:  414 QTC Calculation: 462 R Axis:   -34 Text Interpretation: Normal sinus rhythm Left axis deviation Left ventricular hypertrophy with repolarization  abnormality ( R in aVL , Cornell product ) Abnormal ECG When compared with ECG of 11-Feb-2023 23:37, PREVIOUS ECG IS PRESENT No significant change since last tracing Confirmed by Vanetta Mulders [id number]) on 03/30/2023 5:45:41 PM  Radiology CT HEAD WO CONTRAST  Result Date: 03/30/2023 CLINICAL DATA:  Dizziness and slurred speech. EXAM: CT HEAD WITHOUT CONTRAST TECHNIQUE: Contiguous axial images were obtained from the base of the skull through the vertex without intravenous contrast. RADIATION DOSE REDUCTION: This exam was performed according to the departmental dose-optimization program which includes automated exposure control, adjustment of the mA and/or kV according to patient size and/or use of iterative reconstruction technique. COMPARISON:  February 11, 2023 FINDINGS: Brain: There is mild cerebral atrophy with widening of the extra-axial spaces and ventricular dilatation. There are areas of decreased  attenuation within the white matter tracts of the supratentorial brain, consistent with microvascular disease changes. Multiple small bilateral chronic basal ganglia lacunar infarcts are noted. Vascular: No hyperdense vessel or unexpected calcification. Skull: Normal. Negative for fracture or focal lesion. Sinuses/Orbits: No acute finding. Other: None. IMPRESSION: 1. No acute intracranial abnormality. 2. Generalized cerebral atrophy and microvascular disease changes of the supratentorial brain. 3. Multiple small bilateral chronic basal ganglia lacunar infarcts. Electronically Signed   By: Aram Candela M.D.   On: 03/30/2023 17:28    Procedures Procedures    Medications Ordered in ED Medications  sodium chloride flush (NS) 0.9 % injection 3 mL (3 mLs Intravenous Not Given 03/30/23 1743)  0.9 %  sodium chloride infusion (has no administration in time range)  sodium chloride 0.9 % bolus 1,000 mL (0 mLs Intravenous Stopped 03/30/23 1944)  gadobutrol (GADAVIST) 1 MMOL/ML injection 6 mL (6 mLs Intravenous  Contrast Given 03/30/23 2004)    ED Course/ Medical Decision Making/ A&P                             Medical Decision Making Amount and/or Complexity of Data Reviewed Labs: ordered. Radiology: ordered.  Risk Prescription drug management. Decision regarding hospitalization.   CRITICAL CARE Performed by: Vanetta Mulders Total critical care time: 40 minutes Critical care time was exclusive of separately billable procedures and treating other patients. Critical care was necessary to treat or prevent imminent or life-threatening deterioration. Critical care was time spent personally by me on the following activities: development of treatment plan with patient and/or surrogate as well as nursing, discussions with consultants, evaluation of patient's response to treatment, examination of patient, obtaining history from patient or surrogate, ordering and performing treatments and interventions, ordering and review of laboratory studies, ordering and review of radiographic studies, pulse oximetry and re-evaluation of patient's condition.   Patient with a history of strokes last confirmed in February.  Patient also was evaluated early April and CT angio head and neck and MRI was negative at that time.  Sometimes she has persistent symptoms.  But she is at risk for recurrent strokes.  Patient's neuroexam today without any obvious focal findings however I did not get her up and walk her.  But her coordination finger-to-nose was good.  Maybe a little off on the left but she is right-handed.  Patient's BUN and creatinine markedly elevated here compared to usual for her.  Probably because she is not eating or drinking because of the narcolepsy.  Patient started on IV fluids and given 1 L bolus of normal saline  Discussed with Dr. Iver Nestle neurohospitalist.  She recommended this time since we can do CT angio head and neck to do regular MRI and MRA head and neck without contrast.  Contacted hospitalist  for admission although patient unassigned hospitalist was up for their turn discussed with Dr. Antionette Char.  Stated that to her head CT had no acute findings she is going to need admission either way because of the acute kidney injury and that the MRI results are pending and he has placed her for admission. Final Clinical Impression(s) / ED Diagnoses Final diagnoses:  Acute kidney injury (HCC)  Cerebrovascular accident (CVA), unspecified mechanism [facility])    Rx / DC Orders ED Discharge Orders     None         Vanetta Mulders, MD 03/30/23 2103

## 2023-03-30 NOTE — ED Notes (Signed)
Pt to MRI

## 2023-03-30 NOTE — ED Notes (Signed)
ED TO INPATIENT HANDOFF REPORT  ED Nurse Name and Phone #: Ailene Rud  S Name/Age/Gender Cheryl Levy 72 y.o. female Room/Bed: 008C/008C  Code Status   Code Status: Prior  Home/SNF/Other Home Patient oriented to: self, place, time, and situation Is this baseline? Yes   Triage Complete: Triage complete  Chief Complaint Acute renal failure superimposed on stage 3a chronic kidney disease (HCC) [N17.9, N18.31]  Triage Note PT arrived POV with a c/o of dizzines, balance issues, confusion x 1 day.Last known well was 5-17. Symptoms started yesterday   Allergies Allergies  Allergen Reactions   Ace Inhibitors Other (See Comments)    Chest pain     Level of Care/Admitting Diagnosis ED Disposition     ED Disposition  Admit   Condition  --   Comment  Hospital Area: [facility] [[id number]]  Level of Care: Telemetry Medical [104]  May admit patient to [facility] or [facility] if equivalent level of care is available:: Yes  Covid Evaluation: Asymptomatic - no recent exposure (last 10 days) testing not required  Diagnosis: Acute renal failure superimposed on stage 3a chronic kidney disease [facility]) [[id number]]  Admitting Physician: Briscoe Deutscher [[id number]]  Attending Physician: Briscoe Deutscher [[id number]]  Certification:: I certify this patient will need inpatient services for at least 2 midnights  Estimated Length of Stay: 3          B Medical/Surgery History Past Medical History:  Diagnosis Date   Cancer (HCC)    breast cancer   Hypertension    Thyroid disease    Past Surgical History:  Procedure Laterality Date   ABDOMINAL HYSTERECTOMY     BACK SURGERY     JOINT REPLACEMENT     MASTECTOMY Bilateral    THYROIDECTOMY       A IV Location/Drains/Wounds Patient Lines/Drains/Airways Status     Active Line/Drains/Airways     Name Placement date Placement time Site Days   Peripheral IV 03/30/23 20 G Anterior;Right Forearm 03/30/23  1829   Forearm  less than 1            Intake/Output Last 24 hours  Intake/Output Summary (Last 24 hours) at 03/30/2023 2034 Last data filed at 03/30/2023 1944 Gross per 24 hour  Intake 1000 ml  Output --  Net 1000 ml    Labs/Imaging Results for orders placed or performed during the hospital encounter of 03/30/23 (from the past 48 hour(s))  Protime-INR     Status: None   Collection Time: 03/30/23  5:12 PM  Result Value Ref [place]   Prothrombin Time 13.6 11.4 - 15.2 seconds   INR 1.0 0.8 - 1.2    Comment: (NOTE) INR goal varies based on device and disease states. Performed at [facility], [address], [place], [place] [address]   APTT     Status: None   Collection Time: 03/30/23  5:12 PM  Result Value Ref Range   aPTT 29 24 - 36 seconds    Comment: Performed at [facility], [address], [place], [place] [address]  CBC     Status: None   Collection Time: 03/30/23  5:12 PM  Result Value Ref Range   WBC 7.7 4.0 - 10.5 K/uL   RBC 4.62 3.87 - 5.11 MIL/uL   Hemoglobin 13.2 12.0 - 15.0 g/dL   HCT 95.2 84.1 - 32.4 %   MCV 88.1 80.0 - 100.0 fL   MCH 28.6 26.0 - 34.0 pg  MCHC 32.4 30.0 - 36.0 g/dL   RDW 57.8 46.9 - 62.9 %   Platelets 200 150 - 400 K/uL   nRBC 0.0 0.0 - 0.2 %    Comment: Performed at [facility], [address] [place], [place] [address]  Differential     Status: None   Collection Time: 03/30/23  5:12 PM  Result Value Ref Range   Neutrophils Relative % 70 %   Neutro Abs 5.4 1.7 - 7.7 K/uL   Lymphocytes Relative 14 %   Lymphs Abs 1.1 0.7 - 4.0 K/uL   Monocytes Relative 11 %   Monocytes Absolute 0.8 0.1 - 1.0 K/uL   Eosinophils Relative 4 %   Eosinophils Absolute 0.3 0.0 - 0.5 K/uL   Basophils Relative 1 %   Basophils Absolute 0.1 0.0 - 0.1 K/uL   Immature Granulocytes 0 %   Abs Immature Granulocytes 0.02 0.00 - 0.07 K/uL    Comment: Performed at [facility], [address], [place], [place] [address]  Comprehensive  metabolic panel     Status: Abnormal   Collection Time: 03/30/23  5:12 PM  Result Value Ref Range   Sodium 136 135 - 145 mmol/L   Potassium 3.4 (L) 3.5 - 5.1 mmol/L   Chloride 99 98 - 111 mmol/L   CO2 25 22 - 32 mmol/L   Glucose, Bld 110 (H) 70 - 99 mg/dL    Comment: Glucose reference range applies only to samples taken after fasting for at least 8 hours.   BUN 46 (H) 8 - 23 mg/dL   Creatinine, Ser 1.02 (H) 0.44 - 1.00 mg/dL   Calcium 8.9 8.9 - 72.5 mg/dL   Total Protein 6.5 6.5 - 8.1 g/dL   Albumin 3.5 3.5 - 5.0 g/dL   AST 18 15 - 41 U/L   ALT 15 0 - 44 U/L   Alkaline Phosphatase 74 38 - 126 U/L   Total Bilirubin 0.9 0.3 - 1.2 mg/dL   GFR, Estimated 11 (L) >60 mL/min    Comment: (NOTE) Calculated using the CKD-EPI Creatinine Equation (2021)    Anion gap 12 5 - 15    Comment: Performed at [facility], [address], [place], [place] [address]  Ethanol     Status: None   Collection Time: 03/30/23  5:12 PM  Result Value Ref Range   Alcohol, Ethyl (B) <10 <10 mg/dL    Comment: (NOTE) Lowest detectable limit for serum alcohol is 10 mg/dL.  For medical purposes only. Performed at [facility], [address], [place], [place] [address]   I-stat chem 8, ED     Status: Abnormal   Collection Time: 03/30/23  5:19 PM  Result Value Ref Range   Sodium 140 135 - 145 mmol/L   Potassium 3.5 3.5 - 5.1 mmol/L   Chloride 100 98 - 111 mmol/L   BUN 43 (H) 8 - 23 mg/dL   Creatinine, Ser 2.59 (H) 0.44 - 1.00 mg/dL   Glucose, Bld 563 (H) 70 - 99 mg/dL    Comment: Glucose reference range applies only to samples taken after fasting for at least 8 hours.   Calcium, Ion 1.17 1.15 - 1.40 mmol/L   TCO2 29 22 - 32 mmol/L   Hemoglobin 13.6 12.0 - 15.0 g/dL   HCT 87.5 64.3 - 32.9 %   CT HEAD WO CONTRAST  Result Date: 03/30/2023 CLINICAL DATA:  Dizziness and slurred speech. EXAM: CT HEAD WITHOUT CONTRAST  TECHNIQUE: Contiguous axial images were obtained from the base of the skull  through the vertex without intravenous contrast. RADIATION DOSE REDUCTION: This exam was performed according to the departmental dose-optimization program which includes automated exposure control, adjustment of the mA and/or kV according to patient size and/or use of iterative reconstruction technique. COMPARISON:  February 11, 2023 FINDINGS: Brain: There is mild cerebral atrophy with widening of the extra-axial spaces and ventricular dilatation. There are areas of decreased attenuation within the white matter tracts of the supratentorial brain, consistent with microvascular disease changes. Multiple small bilateral chronic basal ganglia lacunar infarcts are noted. Vascular: No hyperdense vessel or unexpected calcification. Skull: Normal. Negative for fracture or focal lesion. Sinuses/Orbits: No acute finding. Other: None. IMPRESSION: 1. No acute intracranial abnormality. 2. Generalized cerebral atrophy and microvascular disease changes of the supratentorial brain. 3. Multiple small bilateral chronic basal ganglia lacunar infarcts. Electronically Signed   By: Aram Candela M.D.   On: 03/30/2023 17:28    Pending Labs Unresulted Labs (From admission, onward)    None       Vitals/Pain Today's Vitals   03/30/23 1654 03/30/23 1739 03/30/23 1742 03/30/23 1900  BP: 115/67   (!) 115/57  Pulse: 76   61  Resp: 16   17  Temp:   (!) 97.5 F (36.4 C)   TempSrc:   Oral   SpO2: 97%   96%  Weight:  68 kg    Height:  5\' 6"  (1.676 m)    PainSc:        Isolation Precautions No active isolations  Medications Medications  sodium chloride flush (NS) 0.9 % injection 3 mL (3 mLs Intravenous Not Given 03/30/23 1743)  0.9 %  sodium chloride infusion (has no administration in time range)  sodium chloride 0.9 % bolus 1,000 mL (0 mLs Intravenous Stopped 03/30/23 1944)  gadobutrol (GADAVIST) 1 MMOL/ML injection 6 mL (6 mLs Intravenous Contrast Given 03/30/23 2004)    Mobility walks     Focused  Assessments Neuro Assessment Handoff:  Swallow screen pass? Yes    NIH Stroke Scale  Dizziness Present: No (currently denies) Headache Present: No Interval: Shift assessment Level of Consciousness (1a.)   : Alert, keenly responsive LOC Questions (1b. )   : Answers both questions correctly LOC Commands (1c. )   : Performs both tasks correctly Best Gaze (2. )  : Normal Visual (3. )  : No visual loss Facial Palsy (4. )    : Normal symmetrical movements Motor Arm, Left (5a. )   : No drift Motor Arm, Right (5b. ) : No drift Motor Leg, Left (6a. )  : No drift Motor Leg, Right (6b. ) : No drift Limb Ataxia (7. ): Absent Sensory (8. )  : Normal, no sensory loss Best Language (9. )  : No aphasia Dysarthria (10. ): Normal Extinction/Inattention (11.)   : No Abnormality Complete NIHSS TOTAL: 0 Last date known well: 03/29/23 Last time known well: 0959 Neuro Assessment: Exceptions to WDL (pt reports dizziness after she has been up moving around for a few minutes) Neuro Checks:   Initial (03/30/23 1658)  Has TPA been given? No If patient is a Neuro Trauma and patient is going to OR before floor call report to 4N Charge nurse: [phone] or [phone]   R Recommendations: See Admitting Provider Note  Report given to:   Additional Notes:

## 2023-03-30 NOTE — ED Triage Notes (Signed)
PT arrived POV with a c/o of dizzines, balance issues, confusion x 1 day.Last known well was 5-17. Symptoms started yesterday

## 2023-03-30 NOTE — Plan of Care (Signed)
  Problem: Safety: Goal: Ability to remain free from injury will improve Outcome: Not Progressing   

## 2023-03-31 ENCOUNTER — Inpatient Hospital Stay (HOSPITAL_COMMUNITY): Payer: Medicare HMO

## 2023-03-31 DIAGNOSIS — E876 Hypokalemia: Secondary | ICD-10-CM

## 2023-03-31 DIAGNOSIS — N179 Acute kidney failure, unspecified: Secondary | ICD-10-CM | POA: Diagnosis not present

## 2023-03-31 DIAGNOSIS — R2689 Other abnormalities of gait and mobility: Secondary | ICD-10-CM | POA: Diagnosis not present

## 2023-03-31 DIAGNOSIS — N1831 Chronic kidney disease, stage 3a: Secondary | ICD-10-CM | POA: Diagnosis not present

## 2023-03-31 LAB — BASIC METABOLIC PANEL
Anion gap: 6 (ref 5–15)
BUN: 44 mg/dL — ABNORMAL HIGH (ref 8–23)
CO2: 24 mmol/L (ref 22–32)
Calcium: 7.8 mg/dL — ABNORMAL LOW (ref 8.9–10.3)
Chloride: 106 mmol/L (ref 98–111)
Creatinine, Ser: 3.47 mg/dL — ABNORMAL HIGH (ref 0.44–1.00)
GFR, Estimated: 14 mL/min — ABNORMAL LOW (ref >60)
Glucose, Bld: 127 mg/dL — ABNORMAL HIGH (ref 70–99)
Potassium: 3 mmol/L — ABNORMAL LOW (ref 3.5–5.1)
Sodium: 136 mmol/L (ref 135–145)

## 2023-03-31 LAB — CBC
HCT: 33.8 % — ABNORMAL LOW (ref 36.0–46.0)
Hemoglobin: 11.4 g/dL — ABNORMAL LOW (ref 12.0–15.0)
MCH: 29 pg (ref 26.0–34.0)
MCHC: 33.7 g/dL (ref 30.0–36.0)
MCV: 86 fL (ref 80.0–100.0)
Platelets: 133 10*3/uL — ABNORMAL LOW (ref 150–400)
RBC: 3.93 MIL/uL (ref 3.87–5.11)
RDW: 13.8 % (ref 11.5–15.5)
WBC: 5.1 10*3/uL (ref 4.0–10.5)
nRBC: 0 % (ref 0.0–0.2)

## 2023-03-31 LAB — MAGNESIUM: Magnesium: 1.7 mg/dL (ref 1.7–2.4)

## 2023-03-31 MED ORDER — POTASSIUM CHLORIDE CRYS ER 20 MEQ PO TBCR
20.0000 meq | EXTENDED_RELEASE_TABLET | Freq: Once | ORAL | Status: AC
  Administered 2023-03-31: 20 meq via ORAL
  Filled 2023-03-31: qty 1

## 2023-03-31 NOTE — Progress Notes (Addendum)
[facility] PROGRESS NOTE   Agostina Eliason ZOX:[id number] DOB: 1950-11-12 DOA: 03/30/2023  PCP: Burnis Medin, PA-C  Brief History/Interval Summary: 72 y.o. female with medical history significant for hypertension, depression, anxiety, fibromyalgia, history of CVA, and narcolepsy presented to the emergency department with balance difficulty. Patient reports a history of recurrent imbalance since her CVA in February 2024.  This has been significantly worse recently. She has also had a loss of appetite and was not eating or drinking much of anything for the past couple days.  In the emergency department she was found to have acute kidney injury.  MRI brain did not show any new findings.  She was hospitalized for further management.    Consultants: None  Procedures: None    Subjective/Interval History: Feels slightly better today compared to yesterday.  Still fatigued.  No nausea or vomiting.  No abdominal pain.    Assessment/Plan:  Acute kidney injury on chronic kidney disease stage IIIa/hypokalemia Creatinine was 1.07 last month.  Came in with creatinine of 4.2.  Patient started on IV hydration.  Renal function appears to be improving.  Monitor urine output.  Avoid nephrotoxic agents.  Recheck labs tomorrow. Check UA.  Renal ultrasound.  Imbalance in the setting of recent stroke No acute findings noted on imaging studies.  Imbalance could be due to physical deconditioning as well as dehydration.  PT and OT evaluation.  History of stroke Continue Plavix and statin.  History of asthma Stable.  History of depression and anxiety Holding Cymbalta and Paxil.  Essential hypertension Holding antihypertensives.  Brain aneurysms Incidentally noted on MRA.  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 Outpatient monitoring.  DVT  Prophylaxis: Subcutaneous heparin Code Status: Full code Family Communication: Discussed with patient Disposition Plan: Hopefully return home when improved  Status is: Inpatient Remains inpatient appropriate because: Acute kidney injury    Medications: Scheduled:  budesonide (PULMICORT) nebulizer solution  0.25 mg Nebulization BID   clopidogrel  75 mg Oral Daily   heparin  5,000 Units Subcutaneous Q8H   pregabalin  150 mg Oral QHS   rosuvastatin  10 mg Oral Daily   sodium chloride flush  3 mL Intravenous Once   sodium chloride flush  3 mL Intravenous Q12H   Continuous:  sodium chloride 100 mL/hr at 03/31/23 0548   WJX:BJYNWGNFAOZHY, albuterol, oxyCODONE  Antibiotics: Anti-infectives (From admission, onward)    None       Objective:  Vital Signs  Vitals:   03/30/23 2311 03/31/23 0336 03/31/23 0549 03/31/23 0700  BP: 112/64 107/83  109/76  Pulse: 68 64  73  Resp: 18 15  18   Temp: 98 F (36.7 C) (!) 97.3 F (36.3 C)  97.8 F (36.6 C)  TempSrc: Oral Oral  Axillary  SpO2: 97% 93%  100%  Weight:   71 kg   Height:        Intake/Output Summary (Last 24 hours) at 03/31/2023 1007 Last data filed at 03/30/2023 1944 Gross per 24 hour  Intake 1000 ml  Output --  Net 1000 ml   Filed Weights   03/30/23 1739 03/31/23 0549  Weight: 68 kg 71 kg    General appearance: Awake alert.  In no distress Resp: Clear to auscultation bilaterally.  Normal effort Cardio: S1-S2 is normal regular.  No S3-S4.  No rubs murmurs or bruit GI: Abdomen is  soft.  Nontender nondistended.  Bowel sounds are present normal.  No masses organomegaly Extremities: No edema.  Full range of motion of lower extremities. Neurologic: No obvious neurological deficits.   Lab Results:  Data Reviewed: I have personally reviewed following labs and reports of the imaging studies  CBC: Recent Labs  Lab 03/30/23 1712 03/30/23 1719 03/31/23 0535  WBC 7.7  --  5.1  NEUTROABS 5.4  --   --   HGB 13.2  13.6 11.4*  HCT 40.7 40.0 33.8*  MCV 88.1  --  86.0  PLT 200  --  133*    Basic Metabolic Panel: Recent Labs  Lab 03/30/23 1712 03/30/23 1719 03/31/23 0535  NA 136 140 136  K 3.4* 3.5 3.0*  CL 99 100 106  CO2 25  --  24  GLUCOSE 110* 106* 127*  BUN 46* 43* 44*  CREATININE 4.27* 4.50* 3.47*  CALCIUM 8.9  --  7.8*  MG  --   --  1.7    GFR: Estimated Creatinine Clearance: 14.1 mL/min (A) (by C-G formula based on SCr of 3.47 mg/dL (H)).  Liver Function Tests: Recent Labs  Lab 03/30/23 1712  AST 18  ALT 15  ALKPHOS 74  BILITOT 0.9  PROT 6.5  ALBUMIN 3.5    Coagulation Profile: Recent Labs  Lab 03/30/23 1712  INR 1.0     Radiology Studies: MR Brain Wo Contrast (neuro protocol)  Result Date: 03/30/2023 CLINICAL DATA:  Acute neurologic deficit EXAM: MRI HEAD WITHOUT CONTRAST MRA HEAD WITHOUT CONTRAST MRA NECK WITHOUT AND WITH CONTRAST TECHNIQUE: Multiplanar, multi-echo pulse sequences of the brain and surrounding structures were acquired without intravenous contrast. Angiographic images of the Circle of Willis were acquired using MRA technique without intravenous contrast. Angiographic images of the neck were acquired using MRA technique without and with intravenous contrast. Carotid stenosis measurements (when applicable) are obtained utilizing NASCET criteria, using the distal internal carotid diameter as the denominator. CONTRAST:  6mL GADAVIST GADOBUTROL 1 MMOL/ML IV SOLN COMPARISON:  12/30/2022 FINDINGS: MRI HEAD FINDINGS Brain: No acute infarct, mass effect or extra-axial collection. No acute or chronic hemorrhage. There is multifocal hyperintense T2-weighted signal within the white matter. Parenchymal volume and CSF spaces are normal. The midline structures are normal. Vascular: Major flow voids are preserved. Skull and upper cervical spine: Normal calvarium and skull base. Visualized upper cervical spine and soft tissues are normal. Sinuses/Orbits:No paranasal sinus  fluid levels or advanced mucosal thickening. No mastoid or middle ear effusion. Normal orbits. MRA HEAD FINDINGS POSTERIOR CIRCULATION: --Vertebral arteries: Normal --Inferior cerebellar arteries: Normal. 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 Otherwise normal. --Superior cerebellar arteries: Normal. --Posterior cerebral arteries: Normal. The left PCA is predominantly supplied by the posterior communicating artery. ANTERIOR CIRCULATION: --Intracranial internal carotid arteries: Unchanged laterally projecting 3 mm aneurysm arising from the clinoid segment of the left ICA. Normal right ICA. --Anterior cerebral arteries (ACA): Normal. --Middle cerebral arteries (MCA): Normal. MRA NECK FINDINGS Aortic arch: Normal 3 vessel pattern Right carotid system: Normal Left carotid system: Normal Vertebral arteries: Codominant and normal Other: None IMPRESSION: 1. No acute intracranial abnormality. 2. 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 3. 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 4. Mild chronic small vessel ischemic disease. Electronically Signed   By: Deatra Robinson M.D.   On: 03/30/2023 21:03   MR ANGIO HEAD WO  CONTRAST  Result Date: 03/30/2023 CLINICAL DATA:  Acute neurologic deficit EXAM: MRI HEAD WITHOUT CONTRAST MRA HEAD WITHOUT CONTRAST MRA NECK WITHOUT AND WITH CONTRAST TECHNIQUE: Multiplanar, multi-echo pulse sequences of the brain and surrounding structures were acquired without intravenous contrast. Angiographic images of the Circle of Willis were acquired using MRA technique without intravenous contrast. Angiographic images of the neck were acquired using MRA technique without and with intravenous contrast. Carotid stenosis measurements (when applicable) are obtained utilizing NASCET criteria, using the distal internal carotid diameter as the denominator.  CONTRAST:  6mL GADAVIST GADOBUTROL 1 MMOL/ML IV SOLN COMPARISON:  12/30/2022 FINDINGS: MRI HEAD FINDINGS Brain: No acute infarct, mass effect or extra-axial collection. No acute or chronic hemorrhage. There is multifocal hyperintense T2-weighted signal within the white matter. Parenchymal volume and CSF spaces are normal. The midline structures are normal. Vascular: Major flow voids are preserved. Skull and upper cervical spine: Normal calvarium and skull base. Visualized upper cervical spine and soft tissues are normal. Sinuses/Orbits:No paranasal sinus fluid levels or advanced mucosal thickening. No mastoid or middle ear effusion. Normal orbits. MRA HEAD FINDINGS POSTERIOR CIRCULATION: --Vertebral arteries: Normal --Inferior cerebellar arteries: Normal. 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 Otherwise normal. --Superior cerebellar arteries: Normal. --Posterior cerebral arteries: Normal. The left PCA is predominantly supplied by the posterior communicating artery. ANTERIOR CIRCULATION: --Intracranial internal carotid arteries: Unchanged laterally projecting 3 mm aneurysm arising from the clinoid segment of the left ICA. Normal right ICA. --Anterior cerebral arteries (ACA): Normal. --Middle cerebral arteries (MCA): Normal. MRA NECK FINDINGS Aortic arch: Normal 3 vessel pattern Right carotid system: Normal Left carotid system: Normal Vertebral arteries: Codominant and normal Other: None IMPRESSION: 1. No acute intracranial abnormality. 2. 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 3. 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 4. Mild chronic small vessel ischemic disease. Electronically Signed   By: Deatra Robinson M.D.   On: 03/30/2023 21:03   MR Angiogram Neck W or Wo Contrast  Result Date: 03/30/2023 CLINICAL DATA:  Acute neurologic deficit EXAM: MRI HEAD WITHOUT  CONTRAST MRA HEAD WITHOUT CONTRAST MRA NECK WITHOUT AND WITH CONTRAST TECHNIQUE: Multiplanar, multi-echo pulse sequences of the brain and surrounding structures were acquired without intravenous contrast. Angiographic images of the Circle of Willis were acquired using MRA technique without intravenous contrast. Angiographic images of the neck were acquired using MRA technique without and with intravenous contrast. Carotid stenosis measurements (when applicable) are obtained utilizing NASCET criteria, using the distal internal carotid diameter as the denominator. CONTRAST:  6mL GADAVIST GADOBUTROL 1 MMOL/ML IV SOLN COMPARISON:  12/30/2022 FINDINGS: MRI HEAD FINDINGS Brain: No acute infarct, mass effect or extra-axial collection. No acute or chronic hemorrhage. There is multifocal hyperintense T2-weighted signal within the white matter. Parenchymal volume and CSF spaces are normal. The midline structures are normal. Vascular: Major flow voids are preserved. Skull and upper cervical spine: Normal calvarium and skull base. Visualized upper cervical spine and soft tissues are normal. Sinuses/Orbits:No paranasal sinus fluid levels or advanced mucosal thickening. No mastoid or middle ear effusion. Normal orbits. MRA HEAD FINDINGS POSTERIOR CIRCULATION: --Vertebral arteries: Normal --Inferior cerebellar arteries: Normal. 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 Otherwise normal. --Superior cerebellar arteries: Normal. --Posterior cerebral arteries: Normal. The left PCA is predominantly supplied by the posterior communicating artery. ANTERIOR CIRCULATION: --Intracranial internal carotid arteries: Unchanged laterally projecting 3  mm aneurysm arising from the clinoid segment of the left ICA. Normal right ICA. --Anterior cerebral arteries (ACA): Normal. --Middle cerebral arteries (MCA): Normal. MRA NECK FINDINGS Aortic arch: Normal 3 vessel pattern Right carotid system:  Normal Left carotid system: Normal Vertebral arteries: Codominant and normal Other: None IMPRESSION: 1. No acute intracranial abnormality. 2. 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 3. 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 4. Mild chronic small vessel ischemic disease. Electronically Signed   By: Deatra Robinson M.D.   On: 03/30/2023 21:03   CT HEAD WO CONTRAST  Result Date: 03/30/2023 CLINICAL DATA:  Dizziness and slurred speech. EXAM: CT HEAD WITHOUT CONTRAST TECHNIQUE: Contiguous axial images were obtained from the base of the skull through the vertex without intravenous contrast. RADIATION DOSE REDUCTION: This exam was performed according to the departmental dose-optimization program which includes automated exposure control, adjustment of the mA and/or kV according to patient size and/or use of iterative reconstruction technique. COMPARISON:  February 11, 2023 FINDINGS: Brain: There is mild cerebral atrophy with widening of the extra-axial spaces and ventricular dilatation. There are areas of decreased attenuation within the white matter tracts of the supratentorial brain, consistent with microvascular disease changes. Multiple small bilateral chronic basal ganglia lacunar infarcts are noted. Vascular: No hyperdense vessel or unexpected calcification. Skull: Normal. Negative for fracture or focal lesion. Sinuses/Orbits: No acute finding. Other: None. IMPRESSION: 1. No acute intracranial abnormality. 2. Generalized cerebral atrophy and microvascular disease changes of the supratentorial brain. 3. Multiple small bilateral chronic basal ganglia lacunar infarcts. Electronically Signed   By: Aram Candela M.D.   On: 03/30/2023 17:28       LOS: 1 day   Osvaldo Shipper  [facility] Pager on [email or web address]  03/31/2023, 10:07 AM

## 2023-03-31 NOTE — Evaluation (Signed)
Physical Therapy Evaluation Patient Details Name: Cheryl Levy MRN: [id number] DOB: [birth year] Today's Date: 03/31/2023  History of Present Illness  Pt is a 72 y.o. female who presented 03/30/23 with dizzines, balance issues, confusion x 1 day. 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 Admitted with AKI. PMH: hypertension, depression, anxiety, fibromyalgia, history of CVA, and narcolepsy   Clinical Impression  Pt presents with condition above and deficits mentioned below, see PT Problem List. PTA, she was independent without DME. She was living with her husband and her grandson and his family in a 2-level house with 1 STE. Pt rarely goes upstairs. Pt is familiar to this PT from a recent admission. At baseline, this pt has some mild balance deficits but reports it has been getting worse recently, reporting it may be due to her chronic R hip pain getting worse. Pt reports she needs to have the R hip replaced again, but is having difficulty finding time to get that done as she is the primary caregiver for her husband, who is in a w/c with a L AKA. She appears to have [facility] strength in her lower extremities but demonstrates deficits primarily in balance and could benefit from OPPT to address this and her R hip deficits to reduce her risk for falls and improve her ability to care for her husband. Will continue to follow acutely.     Recommendations for follow up therapy are one component of a multi-disciplinary discharge planning process, led by the attending physician.  Recommendations may be updated based on patient status, additional functional criteria and insurance authorization.  Follow Up Recommendations       Assistance Recommended at Discharge PRN  Patient can return home with the following  Assistance with cooking/housework;Assist for transportation;Help with stairs or ramp for  entrance;Direct supervision/assist for financial management;Direct supervision/assist for medications management    Equipment Recommendations None recommended by PT  Recommendations for Other Services       Functional Status Assessment Patient has had a recent decline in their functional status and demonstrates the ability to make significant improvements in function in a reasonable and predictable amount of time.     Precautions / Restrictions Precautions Precautions: Fall Restrictions Weight Bearing Restrictions: No      Mobility  Bed Mobility Overal bed mobility: Modified Independent             General bed mobility comments: HOB elevated, no assistance needed    Transfers Overall transfer level: Needs assistance Equipment used: None Transfers: Sit to/from Stand Sit to Stand: Supervision           General transfer comment: Able to come to stand from EOB without LOB, supervision for safety    Ambulation/Gait Ambulation/Gait assistance: Min guard, Supervision Gait Distance (Feet): 400 Feet Assistive device: None Gait Pattern/deviations: Step-through pattern, Decreased stride length, Antalgic Gait velocity: reduced Gait velocity interpretation: 1.31 - 2.62 ft/sec, indicative of limited community ambulator   General Gait Details: Mild antalgic gait pattern due to R hip pain, resulting in occasional excessive R trunk lateral flexion when ambulating. No LOB with pt intermittently reaching out for wall on R, min guard-supervision for safety  Stairs Stairs: Yes Stairs assistance: Min guard Stair Management: Two rails, Step to pattern, Forwards Number of Stairs: 2 General stair comments: Ascends and descends with bil rails and step-to pattern, no LOB, min guard for safety. Cues provided  to lead down with R leg first due to R hip pain.  Wheelchair Mobility    Modified Rankin (Stroke Patients Only)       Balance Overall balance assessment: Mild deficits  observed, not formally tested                                           Pertinent Vitals/Pain Pain Assessment Pain Assessment: Faces Faces Pain Scale: Hurts little more Pain Location: R hip (chronic) Pain Descriptors / Indicators: Discomfort, Grimacing, Guarding Pain Intervention(s): Limited activity within patient's tolerance, Monitored during session    Home Living Family/patient expects to be discharged to:: Private residence Living Arrangements: Spouse/significant other;Other relatives (grandson and his family; husband is in w/c) Available Help at Discharge: Family Type of Home: House Home Access: Stairs to enter;Ramped entrance   Entrance Stairs-Number of Steps: 1   Home Layout: Two level;Able to live on main level with bedroom/bathroom (rarely goes upstairs) Home Equipment: Grab bars - tub/shower;Shower seat;BSC/3in1;Rolling Environmental consultant (2 wheels);Wheelchair - manual      Prior Function Prior Level of Function : Driving;Independent/Modified Independent             Mobility Comments: Mild imbalance at baseline but does not use AD ADLs Comments: Uses tackle boxes for safe medication storage (away from grandson with Autism) and for sorting     Hand Dominance   Dominant Hand: Right    Extremity/Trunk Assessment   Upper Extremity Assessment Upper Extremity Assessment: Defer to OT evaluation    Lower Extremity Assessment Lower Extremity Assessment: Overall WFL for tasks assessed;RLE deficits/detail RLE Deficits / Details: chronic R hip pain with pt reporting a hx of R THA and need for a new one    Cervical / Trunk Assessment Cervical / Trunk Assessment: Normal  Communication   Communication: No difficulties  Cognition Arousal/Alertness: Awake/alert Behavior During Therapy: WFL for tasks assessed/performed Overall Cognitive Status: Within Functional Limits for tasks assessed                                 General Comments: Follows  cues appropriately and seems aware of her deficits. Questionable memory deficits with pt repeating herself at one point, but could be baseline. Pt reports she feels less confused and more like herself today.        General Comments      Exercises     Assessment/Plan    PT Assessment Patient needs continued PT services  PT Problem List Decreased activity tolerance;Decreased balance;Decreased mobility;Pain       PT Treatment Interventions DME instruction;Gait training;Stair training;Functional mobility training;Therapeutic activities;Therapeutic exercise;Neuromuscular re-education;Balance training;Patient/family education;Cognitive remediation    PT Goals (Current goals can be found in the Care Plan section)  Acute Rehab PT Goals Patient Stated Goal: to improve her balance PT Goal Formulation: With patient/family Time For Goal Achievement: 04/14/23 Potential to Achieve Goals: Good    Frequency Min 3X/week     Co-evaluation               AM-PAC PT "6 Clicks" Mobility  Outcome Measure Help needed turning from your back to your side while in a flat bed without using bedrails?: None Help needed moving from lying on your back to sitting on the side of a flat bed without using bedrails?: None Help needed moving to and from  a bed to a chair (including a wheelchair)?: A Little Help needed standing up from a chair using your arms (e.g., wheelchair or bedside chair)?: A Little Help needed to walk in hospital room?: A Little Help needed climbing 3-5 steps with a railing? : A Little 6 Click Score: 20    End of Session Equipment Utilized During Treatment: Gait belt Activity Tolerance: Patient tolerated treatment well Patient left: in bed;with call bell/phone within reach;with bed alarm set;with family/visitor present   PT Visit Diagnosis: Unsteadiness on feet (R26.81);Other abnormalities of gait and mobility (R26.89);Pain Pain - Right/Left: Right Pain - part of body: Hip     Time: 9604-5409 PT Time Calculation (min) (ACUTE ONLY): 22 min   Charges:   PT Evaluation $PT Eval Moderate Complexity: 1 Mod          Raymond Gurney, PT, DPT Acute Rehabilitation Services  Office: [phone]   Jewel Baize 03/31/2023, 2:46 PM

## 2023-03-31 NOTE — Evaluation (Signed)
Occupational Therapy Evaluation Patient Details Name: Cheryl Levy MRN: [id number] DOB: [birth year] Today's Date: 03/31/2023   History of Present Illness Pt is a 72 y.o. female who presented 03/30/23 with dizzines, balance issues, confusion x 1 day. 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 Admitted with AKI. 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 Patient is known to this OT from previous stroke in Feb 24. Patient able to ambulate to bathroom, complete toileting, and return to bed at mod I level. Patient reports she feels back to her baseline, and symptoms have resolved. Does endorse some upper level cognitive differences, and agreeable to pillbx or upper level cognitive test next session. Patient also reporting no more double vision, with suggestion made to ask neurologist for a referral to a nuero-ophthalmologist. OT not recommending rehab post acute stay, but will continue to follow acutely.      Recommendations for follow up therapy are one component of a multi-disciplinary discharge planning process, led by the attending physician.  Recommendations may be updated based on patient status, additional functional criteria and insurance authorization.   Assistance Recommended at Discharge Frequent or constant Supervision/Assistance (initially)  Patient can return home with the following Direct supervision/assist for medications management;Direct supervision/assist for financial management;Assist for transportation (initially)    Functional Status Assessment  Patient has had a recent decline in their functional status and demonstrates the  ability to make significant improvements in function in a reasonable and predictable amount of time.  Equipment Recommendations  None recommended by OT    Recommendations for Other Services       Precautions / Restrictions Precautions Precautions: Fall Restrictions Weight Bearing Restrictions: No      Mobility Bed Mobility Overal bed mobility: Modified Independent             General bed mobility comments: HOB elevated, no assistance needed    Transfers Overall transfer level: Needs assistance Equipment used: None Transfers: Sit to/from Stand Sit to Stand: Supervision           General transfer comment: Able to come to stand from EOB without LOB, supervision for safety      Balance Overall balance assessment: Mild deficits observed, not formally tested                                         ADL either performed or assessed with clinical judgement   ADL Overall ADL's : Modified independent                                       General ADL Comments: Patient able to ambulate to bathroom, complete toileting, and return to bed. Patient reports she feels back to her baseline, and symptoms have resolved. Does endorse some upper level cognitive differences, and agreeable to pillbx or upper level cognitive test next session.     Vision Baseline Vision/History: 1 Wears glasses Ability to See in Adequate Light: 0 Adequate Patient Visual Report: No  change from baseline Vision Assessment?: Yes Eye Alignment: Within Functional Limits Ocular Range of Motion: Within Functional Limits Alignment/Gaze Preference: Within Defined Limits Tracking/Visual Pursuits: Able to track stimulus in all quads without difficulty Saccades: Within functional limits Convergence: Within functional limits Visual Fields: No apparent deficits Additional Comments: Double vision has now resolved, suggested patient follow up with a neuro opthamologist. Patient  in agreement and will speak with nuerologist for referral     Perception     Praxis      Pertinent Vitals/Pain Pain Assessment Pain Assessment: Faces Faces Pain Scale: Hurts little more Pain Location: R hip (chronic) Pain Descriptors / Indicators: Discomfort, Grimacing, Guarding Pain Intervention(s): Limited activity within patient's tolerance, Monitored during session, Repositioned     Hand Dominance Right   Extremity/Trunk Assessment Upper Extremity Assessment Upper Extremity Assessment: Overall WFL for tasks assessed   Lower Extremity Assessment Lower Extremity Assessment: Defer to PT evaluation RLE Deficits / Details: chronic R hip pain with pt reporting a hx of R THA and need for a new one   Cervical / Trunk Assessment Cervical / Trunk Assessment: Normal   Communication Communication Communication: No difficulties   Cognition Arousal/Alertness: Awake/alert Behavior During Therapy: WFL for tasks assessed/performed Overall Cognitive Status: Within Functional Limits for tasks assessed                                 General Comments: Follows cues appropriately and seems aware of her deficits. Questionable memory deficits with pt repeating herself at one point, but could be baseline. Pt reports she feels less confused and more like herself today.     General Comments       Exercises     Shoulder Instructions      Home Living Family/patient expects to be discharged to:: Private residence Living Arrangements: Spouse/significant other;Other relatives (grandson and his family; husband is in w/c) Available Help at Discharge: Family Type of Home: House Home Access: Stairs to enter;Ramped entrance Entrance Stairs-Number of Steps: 1   Home Layout: Two level;Able to live on main level with bedroom/bathroom (rarely goes upstairs)     Bathroom Shower/Tub: Walk-in shower;Tub only   Bathroom Toilet: Standard     Home Equipment: Grab bars -  tub/shower;Shower seat;BSC/3in1;Rolling Environmental consultant (2 wheels);Wheelchair - manual          Prior Functioning/Environment Prior Level of Function : Driving;Independent/Modified Independent             Mobility Comments: Mild imbalance at baseline but does not use AD ADLs Comments: Uses tackle boxes for safe medication storage (away from grandson with Autism) and for sorting        OT Problem List: Decreased cognition;Decreased activity tolerance      OT Treatment/Interventions: Cognitive remediation/compensation    OT Goals(Current goals can be found in the care plan section) Acute Rehab OT Goals Patient Stated Goal: to get all of this sorted out OT Goal Formulation: With patient Time For Goal Achievement: 04/14/23 Potential to Achieve Goals: Good ADL Goals Pt Will Transfer to Toilet: Independently;ambulating Pt Will Perform Toileting - Clothing Manipulation and hygiene: Independently;sitting/lateral leans;sit to/from stand Additional ADL Goal #1: Patient will be able to complete pill box assessment without errors in order to return to prior level of function.  OT Frequency: Min 2X/week    Co-evaluation              AM-PAC OT "6 Clicks" Daily Activity  Outcome Measure Help from another person eating meals?: None Help from another person taking care of personal grooming?: None Help from another person toileting, which includes using toliet, bedpan, or urinal?: None Help from another person bathing (including washing, rinsing, drying)?: None Help from another person to put on and taking off regular upper body clothing?: None Help from another person to put on and taking off regular lower body clothing?: None 6 Click Score: 24   End of Session Nurse Communication: Mobility status  Activity Tolerance: Patient tolerated treatment well Patient left: in bed;with call bell/phone within reach;with bed alarm set  OT Visit Diagnosis: Unsteadiness on feet (R26.81);Other  symptoms and signs involving cognitive function                Time: 1140-1201 OT Time Calculation (min): 21 min Charges:  OT General Charges $OT Visit: 1 Visit OT Evaluation $OT Eval Moderate Complexity: 1 Mod  Pollyann Glen E. Estalene Bergey, OTR/L Acute Rehabilitation Services [phone]   Cherlyn Cushing 03/31/2023, 4:09 PM

## 2023-03-31 NOTE — TOC Initial Note (Signed)
Transition of Care [facility]) - Initial/Assessment Note    Patient Details  Name: Cheryl Levy MRN: [id number] Date of Birth: Jul 25, 1951  [facility]) CM/SW Contact:    Kermit Balo, RN Phone Number: 03/31/2023, 11:12 AM  Clinical Narrative:                 Pt is from home with her spouse, grandson and his girlfriend and their daughter. She has someone with her most of the time.  She doesn't use any DME at home.  She drives self but says others in the home could provide transportation if needed.  She manages her own medications and denies any issues. Pharmacy: [facility] on [address].  Therapies to eval.  TOC following for d/c needs.   Expected Discharge Plan: Home/Self Care Barriers to Discharge: Continued Medical Work up   Patient Goals and CMS Choice            Expected Discharge Plan and Services       Living arrangements for the past 2 months: Single Family Home                                      Prior Living Arrangements/Services Living arrangements for the past 2 months: Single Family Home Lives with:: Spouse Patient language and need for interpreter reviewed:: Yes Do you feel safe going back to the place where you live?: Yes        Care giver support system in place?: Yes (comment)   Criminal Activity/Legal Involvement Pertinent to Current Situation/Hospitalization: No - Comment as needed  Activities of Daily Living Home Assistive Devices/Equipment: None ADL Screening (condition at time of admission) Patient's cognitive ability adequate to safely complete daily activities?: Yes Is the patient deaf or have difficulty hearing?: No Does the patient have difficulty seeing, even when wearing glasses/contacts?: No Does the patient have difficulty concentrating, remembering, or making decisions?: Yes Patient able to express need for assistance with ADLs?: Yes Does the patient have difficulty dressing or bathing?: No Independently  performs ADLs?: Yes (appropriate for developmental age) Does the patient have difficulty walking or climbing stairs?: Yes Weakness of Legs: None Weakness of Arms/Hands: None  Permission Sought/Granted                  Emotional Assessment Appearance:: Appears stated age Attitude/Demeanor/Rapport: Engaged Affect (typically observed): Accepting Orientation: : Oriented to Self, Oriented to Place, Oriented to  Time, Oriented to Situation   Psych Involvement: No (comment)  Admission diagnosis:  Acute kidney injury (HCC) [N17.9] Cerebrovascular accident (CVA), unspecified mechanism (HCC) [I63.9] Acute renal failure superimposed on stage 3a chronic kidney disease (HCC) [N17.9, N18.31] Patient Active Problem List   Diagnosis Date Noted   Acute renal failure superimposed on stage 3a chronic kidney disease (HCC) 03/30/2023   Mild persistent asthma 03/30/2023   Depression with anxiety 03/30/2023   Chronic pain 03/30/2023   History of ischemic stroke 12/29/2022   Creatinine elevation 12/29/2022   HTN (hypertension) 12/29/2022   Difficulty walking 12/29/2022   History of breast cancer 12/29/2022   Hypersomnia with sleep apnea 07/29/2017   PCP:  Gillian Scarce Pharmacy:   [facility] [id number] [place], [place] - [address] AT [facility] [address] [place] [address] Phone: [phone] Fax: [phone]  [id number] - [place],  - [address] AT [facility] [address] [place] [address] Phone: [phone] Fax: [phone]  [facility] #[id number] - [place], [place] - [address] AT [address] [place] [address] Phone: [phone] Fax: [phone]  [facility] [place] - [facility] [address] [place] [address] Phone: [phone] Fax: [phone]  [facility]  Pharmacy [address] [place] [address] Phone: [phone] Fax: [phone]     Social Determinants of Health (SDOH) Social History: SDOH Screenings   Food Insecurity: No Food Insecurity (03/30/2023)  Housing: Low Risk  (03/30/2023)  Transportation Needs: No Transportation Needs (03/30/2023)  Utilities: Not At Risk (03/30/2023)  Tobacco Use: Low Risk  (03/30/2023)   SDOH Interventions:     Readmission Risk Interventions     No data to display

## 2023-04-01 DIAGNOSIS — N1831 Chronic kidney disease, stage 3a: Secondary | ICD-10-CM | POA: Diagnosis not present

## 2023-04-01 DIAGNOSIS — N179 Acute kidney failure, unspecified: Secondary | ICD-10-CM | POA: Diagnosis not present

## 2023-04-01 LAB — CBC
HCT: 36.9 % (ref 36.0–46.0)
Hemoglobin: 12.3 g/dL (ref 12.0–15.0)
MCH: 28.2 pg (ref 26.0–34.0)
MCHC: 33.3 g/dL (ref 30.0–36.0)
MCV: 84.6 fL (ref 80.0–100.0)
Platelets: 145 10*3/uL — ABNORMAL LOW (ref 150–400)
RBC: 4.36 MIL/uL (ref 3.87–5.11)
RDW: 13.4 % (ref 11.5–15.5)
WBC: 5.7 10*3/uL (ref 4.0–10.5)
nRBC: 0 % (ref 0.0–0.2)

## 2023-04-01 LAB — BASIC METABOLIC PANEL
Anion gap: 7 (ref 5–15)
BUN: 26 mg/dL — ABNORMAL HIGH (ref 8–23)
CO2: 29 mmol/L (ref 22–32)
Calcium: 8.7 mg/dL — ABNORMAL LOW (ref 8.9–10.3)
Chloride: 105 mmol/L (ref 98–111)
Creatinine, Ser: 1.41 mg/dL — ABNORMAL HIGH (ref 0.44–1.00)
GFR, Estimated: 40 mL/min — ABNORMAL LOW (ref >60)
Glucose, Bld: 116 mg/dL — ABNORMAL HIGH (ref 70–99)
Potassium: 3.4 mmol/L — ABNORMAL LOW (ref 3.5–5.1)
Sodium: 141 mmol/L (ref 135–145)

## 2023-04-01 MED ORDER — AMLODIPINE BESYLATE 2.5 MG PO TABS
2.5000 mg | ORAL_TABLET | Freq: Every day | ORAL | Status: DC
  Administered 2023-04-01: 2.5 mg via ORAL
  Filled 2023-04-01: qty 1

## 2023-04-01 MED ORDER — SENNOSIDES-DOCUSATE SODIUM 8.6-50 MG PO TABS: 2.0000 | ORAL_TABLET | Freq: Two times a day (BID) | ORAL | 0 refills | Status: DC

## 2023-04-01 MED ORDER — POTASSIUM CHLORIDE CRYS ER 20 MEQ PO TBCR
40.0000 meq | EXTENDED_RELEASE_TABLET | Freq: Once | ORAL | Status: AC
  Administered 2023-04-01: 40 meq via ORAL
  Filled 2023-04-01: qty 2

## 2023-04-01 MED ORDER — SENNOSIDES-DOCUSATE SODIUM 8.6-50 MG PO TABS
2.0000 | ORAL_TABLET | Freq: Two times a day (BID) | ORAL | Status: DC
  Filled 2023-04-01: qty 2

## 2023-04-01 MED ORDER — CLONIDINE HCL 0.1 MG PO TABS
0.2000 mg | ORAL_TABLET | Freq: Two times a day (BID) | ORAL | Status: DC
  Administered 2023-04-01: 0.2 mg via ORAL
  Filled 2023-04-01: qty 2

## 2023-04-01 MED ORDER — POLYETHYLENE GLYCOL 3350 17 G PO PACK: 17.0000 g | PACK | Freq: Every day | ORAL | 0 refills | Status: DC

## 2023-04-01 MED ORDER — POLYETHYLENE GLYCOL 3350 17 G PO PACK
17.0000 g | PACK | Freq: Every day | ORAL | Status: DC
  Filled 2023-04-01: qty 1

## 2023-04-01 NOTE — TOC Transition Note (Signed)
Transition of Care [facility]) - CM/SW Discharge Note   Patient Details  Name: Cheryl Levy MRN: [id number] Date of Birth: Dec 04, 1950  Transition of [facility]) CM/SW Contact:  Lockie Pares, RN Phone Number: 04/01/2023, 10:22 AM   Clinical Narrative:    Spoke to Cheryl Levy via phone for discharge planning. She is agreeable to outpatient PT and has transportation. No further needs expressed for DME or needs for DC    Final next level of care: Home/Self Care Barriers to Discharge: No Barriers Identified   Patient Goals and CMS Choice      Discharge Placement                         Discharge Plan and Services Additional resources added to the After Visit Summary for                                       Social Determinants of Health (SDOH) Interventions SDOH Screenings   Food Insecurity: No Food Insecurity (03/30/2023)  Housing: Low Risk  (03/30/2023)  Transportation Needs: No Transportation Needs (03/30/2023)  Utilities: Not At Risk (03/30/2023)  Tobacco Use: Low Risk  (03/30/2023)     Readmission Risk Interventions     No data to display

## 2023-04-01 NOTE — Progress Notes (Signed)
Occupational Therapy Treatment Patient Details Name: Cheryl Levy MRN: [id number] DOB: 07-01-1951 Today's Date: 04/01/2023   History of present illness Pt is a 72 y.o. female who presented 03/30/23 with dizzines, balance issues, confusion x 1 day. 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 Admitted with AKI. PMH: hypertension, depression, anxiety, fibromyalgia, history of CVA, and narcolepsy   OT comments  Administered pill box test during OT session to further assess functional cognitive tests. Pt did complete test in 7 min (typically 5 min allotted) and made 2 errors resulting in fail for the test. Reviewed pt's errors and problem solved other strategies to implement, as well as having second person check pill box assortment weekly as pt reports managing hers and her spouse's meds at baseline.   Recommendations for follow up therapy are one component of a multi-disciplinary discharge planning process, led by the attending physician.  Recommendations may be updated based on patient status, additional functional criteria and insurance authorization.    Assistance Recommended at Discharge Frequent or constant Supervision/Assistance (initially)  Patient can return home with the following  Direct supervision/assist for medications management;Direct supervision/assist for financial management   Equipment Recommendations  None recommended by OT    Recommendations for Other Services      Precautions / Restrictions Precautions Precautions: Fall Restrictions Weight Bearing Restrictions: No       Mobility Bed Mobility Overal bed mobility: Modified Independent                  Transfers                         Balance                                           ADL either performed or assessed with clinical judgement   ADL Overall ADL's :  Modified independent                                       General ADL Comments: Physically managing ADLs/mobility well though some higher level cognitive changes noted. administered pill box test (see cognition section for details)    Extremity/Trunk Assessment Upper Extremity Assessment Upper Extremity Assessment: Overall WFL for tasks assessed   Lower Extremity Assessment Lower Extremity Assessment: Defer to PT evaluation        Vision   Vision Assessment?: Yes;No apparent visual deficits   Perception     Praxis      Cognition Arousal/Alertness: Awake/alert Behavior During Therapy: WFL for tasks assessed/performed Overall Cognitive Status: Impaired/Different from baseline Area of Impairment: Attention, Awareness, Problem solving                   Current Attention Level: Selective       Awareness: Emergent Problem Solving: Requires verbal cues General Comments: pleasant, does endorse sleep deprivation so may have affected presentation. administered pill box test with 7 miin required to complete due to decreased attention (5 min typically alloted). pt did accidentally place additional pills in 2 slots and did not fill out 3rd slot for 3x/weekly pill. After test, did go through it with pt and pt reports only  having 1-2x day medication. emphasized double checking pt's work when completing pill box and having someone else go through it as well initially as pt manages hers and her husbands meds. pt reports in the past accidentally giving pt evening meds all week rather than morning at appropriate time with pt acting "drunk"        Exercises      Shoulder Instructions       General Comments      Pertinent Vitals/ Pain       Pain Assessment Pain Assessment: No/denies pain  Home Living                                          Prior Functioning/Environment              Frequency  Min 2X/week        Progress Toward  Goals  OT Goals(current goals can now be found in the care plan section)  Progress towards OT goals: Progressing toward goals  Acute Rehab OT Goals Patient Stated Goal: home soon OT Goal Formulation: With patient Time For Goal Achievement: 04/14/23 Potential to Achieve Goals: Good ADL Goals Pt Will Transfer to Toilet: Independently;ambulating Pt Will Perform Toileting - Clothing Manipulation and hygiene: Independently;sitting/lateral leans;sit to/from stand Additional ADL Goal #1: Patient will be able to complete pill box assessment without errors in order to return to prior level of function.  Plan Discharge plan remains appropriate    Co-evaluation                 AM-PAC OT "6 Clicks" Daily Activity     Outcome Measure   Help from another person eating meals?: None Help from another person taking care of personal grooming?: None Help from another person toileting, which includes using toliet, bedpan, or urinal?: None Help from another person bathing (including washing, rinsing, drying)?: None Help from another person to put on and taking off regular upper body clothing?: None Help from another person to put on and taking off regular lower body clothing?: None 6 Click Score: 24    End of Session    OT Visit Diagnosis: Unsteadiness on feet (R26.81);Other symptoms and signs involving cognitive function   Activity Tolerance Patient tolerated treatment well   Patient Left in bed;with call bell/phone within reach;with bed alarm set   Nurse Communication          Time: 7846-9629 OT Time Calculation (min): 24 min  Charges: OT General Charges $OT Visit: 1 Visit OT Treatments $Self Care/Home Management : 8-22 mins  Bradd Canary, OTR/L Acute Rehab Services Office: [phone]   Lorre Munroe 04/01/2023, 1:55 PM

## 2023-04-01 NOTE — Progress Notes (Signed)
Discharge instructions reviewed with patient. All questions answered. Patient belongings returned. She has called her husband for transport home. IV removed Melony Overly, RN

## 2023-04-01 NOTE — Discharge Summary (Signed)
[facility]  Physician Discharge Summary   Patient ID: Cheryl Levy MRN: [id number] DOB/AGE: [birth year] 72 y.o.  Admit date: 03/30/2023 Discharge date: 04/01/2023    PCP: Burnis Medin, PA-C  DISCHARGE DIAGNOSES:    Acute renal failure superimposed on stage 3a chronic kidney disease (HCC)   History of ischemic stroke   HTN (hypertension)   Mild persistent asthma   Depression with anxiety   RECOMMENDATIONS FOR OUTPATIENT FOLLOW UP: Patient instructed to stay well-hydrated for the next few days.   Home Health: Outpatient physical therapy Equipment/Devices: None  CODE STATUS: Full code  DISCHARGE CONDITION: fair  Diet recommendation: As before  INITIAL HISTORY: 72 y.o. female with medical history significant for hypertension, depression, anxiety, fibromyalgia, history of CVA, and narcolepsy presented to the emergency department with balance difficulty. Patient reports a history of recurrent imbalance since her CVA in February 2024.  This has been significantly worse recently. She has also had a loss of appetite and was not eating or drinking much of anything for the past couple days.  In the emergency department she was found to have acute kidney injury.  MRI brain did not show any new findings.  She was hospitalized for further management.    HOSPITAL COURSE:   Acute kidney injury on chronic kidney disease stage IIIa/hypokalemia Creatinine was 1.07 last month.  Came in with creatinine of 4.2.  Patient started on IV hydration.   Renal function has improved and almost back to baseline.  Has good urine output.  Patient wants to stay well-hydrated at home.  Renal ultrasound did not show any hydronephrosis.  UA was ordered but never completed.     Imbalance in the setting of recent stroke No acute findings noted on imaging studies.  Imbalance could be due to physical deconditioning as well as dehydration.  Seen by physical therapy.  Outpatient PT has been ordered.    History of stroke Continue Plavix and statin.   History of asthma Stable.   History of depression and anxiety Holding Cymbalta and Paxil.   Essential hypertension Initially antihypertensives were held.  Patient experienced rebound hypertension.  Started back on her medications this morning with improvement in blood pressure.   Brain aneurysms Incidentally noted on MRA.  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 Outpatient monitoring.    Patient is stable.  Blood pressure is improved after she was started back on her home medications.  Okay for discharge.   PERTINENT LABS:  The results of significant diagnostics from this hospitalization (including imaging, microbiology, ancillary and laboratory) are listed below for reference.    Labs:   Basic Metabolic Panel: Recent Labs  Lab 03/30/23 1712 03/30/23 1719 03/31/23 0535 04/01/23 0456  NA 136 140 136 141  K 3.4* 3.5 3.0* 3.4*  CL 99 100 106 105  CO2 25  --  24 29  GLUCOSE 110* 106* 127* 116*  BUN 46* 43* 44* 26*  CREATININE 4.27* 4.50* 3.47* 1.41*  CALCIUM 8.9  --  7.8* 8.7*  MG  --   --  1.7  --    Liver Function Tests: Recent Labs  Lab 03/30/23 1712  AST 18  ALT 15  ALKPHOS 74  BILITOT 0.9  PROT 6.5  ALBUMIN 3.5    CBC: Recent Labs  Lab 03/30/23 1712 03/30/23 1719 03/31/23 0535 04/01/23 0456  WBC 7.7  --  5.1 5.7  NEUTROABS 5.4  --   --   --   HGB 13.2 13.6 11.4* 12.3  HCT 40.7 40.0 33.8* 36.9  MCV 88.1  --  86.0 84.6  PLT 200  --  133* 145*      IMAGING STUDIES US RENAL  Result Date: 03/31/2023 CLINICAL DATA:  [id number] AKI (acute kidney injury) (HCC) [id number] EXAM: RENAL / URINARY TRACT ULTRASOUND COMPLETE COMPARISON:  None Available. FINDINGS: Right Kidney: Renal measurements: 11.0 x 4.3 x 5.2 cm = volume: 127.9 mL. No hydronephrosis. Mildly increased renal  cortical echogenicity. Left Kidney: Renal measurements: 11.1 x 4.5 x 4.2 cm = volume: 108.2 mL. No hydronephrosis. Mildly increased renal cortical echogenicity. Bladder: Appears normal for degree of bladder distention. Other: None. IMPRESSION: No hydronephrosis. Mildly increased renal cortical echogenicity bilaterally, as can be seen in medical renal disease. Electronically Signed   By: Caprice Renshaw M.D.   On: 03/31/2023 16:59   MR Brain Wo Contrast (neuro protocol)  Result Date: 03/30/2023 CLINICAL DATA:  Acute neurologic deficit EXAM: MRI HEAD WITHOUT CONTRAST MRA HEAD WITHOUT CONTRAST MRA NECK WITHOUT AND WITH CONTRAST TECHNIQUE: Multiplanar, multi-echo pulse sequences of the brain and surrounding structures were acquired without intravenous contrast. Angiographic images of the Circle of Willis were acquired using MRA technique without intravenous contrast. Angiographic images of the neck were acquired using MRA technique without and with intravenous contrast. Carotid stenosis measurements (when applicable) are obtained utilizing NASCET criteria, using the distal internal carotid diameter as the denominator. CONTRAST:  6mL GADAVIST GADOBUTROL 1 MMOL/ML IV SOLN COMPARISON:  12/30/2022 FINDINGS: MRI HEAD FINDINGS Brain: No acute infarct, mass effect or extra-axial collection. No acute or chronic hemorrhage. There is multifocal hyperintense T2-weighted signal within the white matter. Parenchymal volume and CSF spaces are normal. The midline structures are normal. Vascular: Major flow voids are preserved. Skull and upper cervical spine: Normal calvarium and skull base. Visualized upper cervical spine and soft tissues are normal. Sinuses/Orbits:No paranasal sinus fluid levels or advanced mucosal thickening. No mastoid or middle ear effusion. Normal orbits. MRA HEAD FINDINGS POSTERIOR CIRCULATION: --Vertebral arteries: Normal --Inferior cerebellar arteries: Normal. 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 Otherwise normal. --Superior cerebellar arteries: Normal. --Posterior cerebral arteries: Normal. The left PCA is predominantly supplied by the posterior communicating artery. ANTERIOR CIRCULATION: --Intracranial internal carotid arteries: Unchanged laterally projecting 3 mm aneurysm arising from the clinoid segment of the left ICA. Normal right ICA. --Anterior cerebral arteries (ACA): Normal. --Middle cerebral arteries (MCA): Normal. MRA NECK FINDINGS Aortic arch: Normal 3 vessel pattern Right carotid system: Normal Left carotid system: Normal Vertebral arteries: Codominant and normal Other: None IMPRESSION: 1. No acute intracranial abnormality. 2. 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 3. 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 4. Mild chronic small vessel ischemic disease. Electronically Signed   By: Deatra Robinson M.D.   On: 03/30/2023 21:03   MR ANGIO HEAD WO CONTRAST  Result Date: 03/30/2023 CLINICAL DATA:  Acute neurologic deficit EXAM: MRI HEAD WITHOUT CONTRAST MRA HEAD WITHOUT CONTRAST MRA NECK WITHOUT AND WITH CONTRAST TECHNIQUE: Multiplanar, multi-echo pulse sequences of the brain and surrounding structures were acquired without intravenous contrast. Angiographic images of the Circle of Willis were acquired using MRA technique without intravenous contrast. Angiographic images of the neck were acquired using MRA technique without and with intravenous contrast. Carotid stenosis measurements (when  applicable) are obtained utilizing NASCET criteria, using the distal internal carotid diameter as the denominator. CONTRAST:  6mL GADAVIST GADOBUTROL 1 MMOL/ML IV SOLN COMPARISON:  12/30/2022 FINDINGS: MRI HEAD FINDINGS Brain: No acute infarct, mass effect or extra-axial collection. No acute or chronic hemorrhage. There is multifocal hyperintense T2-weighted  signal within the white matter. Parenchymal volume and CSF spaces are normal. The midline structures are normal. Vascular: Major flow voids are preserved. Skull and upper cervical spine: Normal calvarium and skull base. Visualized upper cervical spine and soft tissues are normal. Sinuses/Orbits:No paranasal sinus fluid levels or advanced mucosal thickening. No mastoid or middle ear effusion. Normal orbits. MRA HEAD FINDINGS POSTERIOR CIRCULATION: --Vertebral arteries: Normal --Inferior cerebellar arteries: Normal. 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 Otherwise normal. --Superior cerebellar arteries: Normal. --Posterior cerebral arteries: Normal. The left PCA is predominantly supplied by the posterior communicating artery. ANTERIOR CIRCULATION: --Intracranial internal carotid arteries: Unchanged laterally projecting 3 mm aneurysm arising from the clinoid segment of the left ICA. Normal right ICA. --Anterior cerebral arteries (ACA): Normal. --Middle cerebral arteries (MCA): Normal. MRA NECK FINDINGS Aortic arch: Normal 3 vessel pattern Right carotid system: Normal Left carotid system: Normal Vertebral arteries: Codominant and normal Other: None IMPRESSION: 1. No acute intracranial abnormality. 2. 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 3. 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 4. Mild chronic small vessel ischemic disease. Electronically Signed   By: Deatra Robinson M.D.   On: 03/30/2023 21:03   MR Angiogram Neck W or Wo Contrast  Result Date: 03/30/2023 CLINICAL DATA:  Acute neurologic deficit EXAM: MRI HEAD WITHOUT CONTRAST MRA HEAD WITHOUT CONTRAST MRA NECK WITHOUT AND WITH CONTRAST TECHNIQUE: Multiplanar, multi-echo pulse sequences of the brain and surrounding structures were acquired without intravenous contrast. Angiographic images of the Circle of  Willis were acquired using MRA technique without intravenous contrast. Angiographic images of the neck were acquired using MRA technique without and with intravenous contrast. Carotid stenosis measurements (when applicable) are obtained utilizing NASCET criteria, using the distal internal carotid diameter as the denominator. CONTRAST:  6mL GADAVIST GADOBUTROL 1 MMOL/ML IV SOLN COMPARISON:  12/30/2022 FINDINGS: MRI HEAD FINDINGS Brain: No acute infarct, mass effect or extra-axial collection. No acute or chronic hemorrhage. There is multifocal hyperintense T2-weighted signal within the white matter. Parenchymal volume and CSF spaces are normal. The midline structures are normal. Vascular: Major flow voids are preserved. Skull and upper cervical spine: Normal calvarium and skull base. Visualized upper cervical spine and soft tissues are normal. Sinuses/Orbits:No paranasal sinus fluid levels or advanced mucosal thickening. No mastoid or middle ear effusion. Normal orbits. MRA HEAD FINDINGS POSTERIOR CIRCULATION: --Vertebral arteries: Normal --Inferior cerebellar arteries: Normal. 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 Otherwise normal. --Superior cerebellar arteries: Normal. --Posterior cerebral arteries: Normal. The left PCA is predominantly supplied by the posterior communicating artery. ANTERIOR CIRCULATION: --Intracranial internal carotid arteries: Unchanged laterally projecting 3 mm aneurysm arising from the clinoid segment of the left ICA. Normal right ICA. --Anterior cerebral arteries (ACA): Normal. --Middle cerebral arteries (MCA): Normal. MRA NECK FINDINGS Aortic arch: Normal 3 vessel pattern Right carotid system: Normal Left carotid system: Normal Vertebral arteries: Codominant and normal Other: None IMPRESSION: 1. No acute intracranial abnormality. 2. 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 3. Unchanged 3 mm aneurysm or  infundibulum  of a thrombosed left PCA P1 segment at the distal basilar artery. 4. Mild chronic small vessel ischemic disease. Electronically Signed   By: Deatra Robinson M.D.   On: 03/30/2023 21:03   CT HEAD WO CONTRAST  Result Date: 03/30/2023 CLINICAL DATA:  Dizziness and slurred speech. EXAM: CT HEAD WITHOUT CONTRAST TECHNIQUE: Contiguous axial images were obtained from the base of the skull through the vertex without intravenous contrast. RADIATION DOSE REDUCTION: This exam was performed according to the departmental dose-optimization program which includes automated exposure control, adjustment of the mA and/or kV according to patient size and/or use of iterative reconstruction technique. COMPARISON:  February 11, 2023 FINDINGS: Brain: There is mild cerebral atrophy with widening of the extra-axial spaces and ventricular dilatation. There are areas of decreased attenuation within the white matter tracts of the supratentorial brain, consistent with microvascular disease changes. Multiple small bilateral chronic basal ganglia lacunar infarcts are noted. Vascular: No hyperdense vessel or unexpected calcification. Skull: Normal. Negative for fracture or focal lesion. Sinuses/Orbits: No acute finding. Other: None. IMPRESSION: 1. No acute intracranial abnormality. 2. Generalized cerebral atrophy and microvascular disease changes of the supratentorial brain. 3. Multiple small bilateral chronic basal ganglia lacunar infarcts. Electronically Signed   By: Aram Candela M.D.   On: 03/30/2023 17:28    DISCHARGE EXAMINATION: Vitals:   04/01/23 0526 04/01/23 0856 04/01/23 1015 04/01/23 1205  BP:  (!) 201/85 (!) 182/77 (!) 173/87  Pulse:  85 85 78  Resp:  20 18 18   Temp:  97.6 F (36.4 C)  98.1 F (36.7 C)  TempSrc:  Oral  Oral  SpO2:  94% 98% 99%  Weight: 71.6 kg     Height:       General appearance: Awake alert.  In no distress Resp: Clear to auscultation bilaterally.  Normal effort Cardio: S1-S2 is  normal regular.  No S3-S4.  No rubs murmurs or bruit GI: Abdomen is soft.  Nontender nondistended.  Bowel sounds are present normal.  No masses organomegaly   DISPOSITION: Home  Discharge Instructions     Ambulatory referral to Physical Therapy   Complete by: As directed    Outpatient PT   Call MD for:  difficulty breathing, headache or visual disturbances   Complete by: As directed    Call MD for:  extreme fatigue   Complete by: As directed    Call MD for:  persistant dizziness or light-headedness   Complete by: As directed    Call MD for:  persistant nausea and vomiting   Complete by: As directed    Call MD for:  severe uncontrolled pain   Complete by: As directed    Call MD for:  temperature >100.4   Complete by: As directed    Diet - low sodium heart healthy   Complete by: As directed    Discharge instructions   Complete by: As directed    Ask your PCP whether you need to resume the medication for narcolepsy or not. Take other medicines as before.  You were cared for by a hospitalist during your hospital stay. If you have any questions about your discharge medications or the care you received while you were in the hospital after you are discharged, you can call the unit and asked to speak with the hospitalist on call if the hospitalist that took care of you is not available. Once you are discharged, your primary care physician will handle any further medical issues. Please note that NO REFILLS for any discharge  medications will be authorized once you are discharged, as it is imperative that you return to your primary care physician (or establish a relationship with a primary care physician if you do not have one) for your aftercare needs so that they can reassess your need for medications and monitor your lab values. If you do not have a primary care physician, you can call [phone] for a physician referral.   Increase activity slowly   Complete by: As directed            Allergies as of 04/01/2023       Reactions   Ace Inhibitors Other (See Comments)   Chest pain        Medication List     STOP taking these medications    methylphenidate 10 MG tablet Commonly known as: RITALIN       TAKE these medications    amLODipine 2.5 MG tablet Commonly known as: NORVASC Take 1 tablet (2.5 mg total) by mouth daily.   beclomethasone 80 MCG/ACT inhaler Commonly known as: Qvar Inhale 2 puffs into the lungs 2 (two) times daily. What changed:  when to take this reasons to take this   cloNIDine 0.2 MG tablet Commonly known as: CATAPRES Take 0.2 mg by mouth 2 (two) times daily.   clopidogrel 75 MG tablet Commonly known as: PLAVIX Take 75 mg by mouth daily.   cyanocobalamin 1000 MCG tablet Commonly known as: VITAMIN B12 Take 1 tablet (1,000 mcg total) by mouth daily.   DULoxetine 60 MG capsule Commonly known as: CYMBALTA Take 120 mg by mouth daily.   Fluticasone-Salmeterol 500-50 MCG/DOSE Aepb Commonly known as: ADVAIR Inhale 1 puff into the lungs 2 (two) times daily as needed (for breathing).   omeprazole 40 MG capsule Commonly known as: PRILOSEC Take 80 mg by mouth at bedtime.   PARoxetine 20 MG tablet Commonly known as: PAXIL Take 1 tablet (20 mg total) by mouth daily.   polyethylene glycol 17 g packet Commonly known as: MIRALAX / GLYCOLAX Take 17 g by mouth daily.   pregabalin 200 MG capsule Commonly known as: LYRICA Take 600 mg by mouth at bedtime.   protriptyline 10 MG tablet Commonly known as: VIVACTIL Take 10 mg by mouth at bedtime.   rosuvastatin 20 MG tablet Commonly known as: CRESTOR Take 1 tablet (20 mg total) by mouth daily.   senna-docusate 8.6-50 MG tablet Commonly known as: Senokot-S Take 2 tablets by mouth 2 (two) times daily.          Follow-up Information     [facility] Follow up.   Specialty: Rehabilitation Why: They will call you to  schedule Contact information: [address]  [address] [id number] mc [address] [place] [address] [phone]        [place], [place], [place]. Schedule an appointment as soon as possible for a visit in 1 week(s).   Specialty: Family Medicine Why: post hospitalization follow up Contact information: [address] [place] [address] [phone]                 TOTAL DISCHARGE TIME: 35 minutes  Cheryl Levy  [facility] Pager on [email or web address]  04/02/2023, 10:05 AM

## 2023-05-17 ENCOUNTER — Encounter: Payer: Self-pay | Admitting: Cardiology

## 2023-05-17 ENCOUNTER — Ambulatory Visit: Payer: Medicare HMO | Attending: Cardiology | Admitting: Cardiology

## 2023-09-09 NOTE — Progress Notes (Signed)
NEUROLOGY FOLLOW UP OFFICE NOTE  Yalina Peagler [id number]  Subjective:  Nilani Delahoz is a 72 y.o. year old right-handed female with a medical history of stroke, HTN, HLD, B12 deficiency, left breast cancer, asthma, GERD, hyperthyroidism, anxiety, depression, narcolepsy, fibromyalgia/chronic pain syndrome, migraine who we last saw on 03/17/23 for stroke.  To briefly review: On [birth year] (patient's [birth year]) patient was walking all over the place (side to side). She felt fine, without imbalance, and even drove home. The next day patient was talking to her daughter and per daughter, she was not making sense when talking. She went to the hospital on 12/27/22 because of this.    Per documentation, patient presented to ED on 12/29/22 for dizziness, double vision, and confusion. CT and MRI were concerning for stroke, though not acute. MRA showed moderate, primarily posterior circulation, stenosis. 30 day heart monitoring was recommended. Holter showed no evidence of afib and was essentially unremarkable.   Patient was started on DAPT with aspirin and plavix. She was told to stop aspirin by other neurologist since mid 01/2023 ([facility]). She is currently on Plavix 75 mg daily. She is also on Crestor 20 mg daily.   Currently patient denies symptoms. She does endorse some more long standing cognitive changes. She states she cannot count or do math in her head. She has difficulty spelling. She has some memory problems which has been going on for > 1 year. She states she had neuropsych testing in the past year at [facility] that she was told was nothing concerning. I do not see these results.   Patient does endorse a very stressful life. She takes care of husband who has had amputations. Her son tried to kill self last year, and now found to have cancer and CHF at 70 years old   Patient previously took B12 shots, but is not currently on supplementation. She is also not on vit D supplementation.   Patient states her  sleep is currently okay. She does not think she snores. She does not feel well rested in the morning. She is tired throughout the day. She does take naps during day. She last had a sleep study over 10 years ago. Those results are not available to me.   Patient has previously been seen in neurology by [facility] (last on 02/08/23). Most recent assessment and plan: IMPRESSION:  Small left and right lacunar CVAs 12/29/22 3 mm aneurysm left ICA. Fibromyalgia Narcolepsy Migraines rare Unable to work due to pain/narcolepsy, poor concentration Low B12  PLAN:  Not taking Ritalin due to strokes Continue Plavix, etc, not aspirin Sunosi 150 mg/d    Smoking: Never EtOH use: Never Drug use: Never Restrictive diet: No  Most recent Assessment and Plan (03/17/23): Ludell Manross is a 72 y.o. female who is being seen for history of strokes. She has a relevant medical history of stroke, HTN, HLD, B12 deficiency, left breast cancer, asthma, GERD, hyperthyroidism, anxiety, depression, narcolepsy, fibromyalgia/chronic pain syndrome, migraine. Her neurological examination is pertinent for mild cognitive changes, possible diminished sensation on left (though patchy), and mild dysmetria in LUE. Patient's MRI brain appears consistent with severe chronic microvascular ischemia, however her HTN and HLD seems well controlled. There appears to be more significant changes than would be expected for symptoms. I want to send labs to look for other possible causes including autoimmune/vasculitis screening. I will also be repeating imaging in 6 months to ensure there is not ongoing changes. While cancer and mets are always a possibility, previous imaging did  not show this (12/2022).   PLAN: -Blood work: ESR, CRP, ANA, ANCA, B12 -Sleep medicine referral for narcolepsy and possible OSA -Restart B12 supplementation 1000 mcg daily -Vitamin D supplementation 1000 internation units (IU) daily -Continue Plavix 75 mg daily -Continue  Crestor 20 mg daily -Will repeat MRI brain w/wo contrast and vessel imaging (CTA) in 6 months -Stroke warning signs discussed and given in AVS  Since their last visit: Labs were significant for borderline B12. I recommended B12 1000 mcg daily. She is taking this.  Patient is feeling well today. She is exhausted and stressed due to taking care of her husband.  Patient presented to ED on 03/30/23 for balance problems and repeat imaging showed no acute process.  She continue to take Plavix 75 mg daily and Crestor 20 mg daily.  She did not see sleep medicine. She does not think she was ever called. For now, she is being followed at [facility] neurology by Dr. Alton Revere for her narcolepsy.  She has no new complaints today.  MEDICATIONS:  Outpatient Encounter Medications as of 09/17/2023  Medication Sig Note   cloNIDine (CATAPRES) 0.2 MG tablet Take 0.2 mg by mouth 2 (two) times daily.    clopidogrel (PLAVIX) 75 MG tablet Take 75 mg by mouth daily.    cyanocobalamin 1000 MCG tablet Take 1 tablet (1,000 mcg total) by mouth daily.    DULoxetine (CYMBALTA) 60 MG capsule Take 120 mg by mouth daily.    Fluticasone-Salmeterol (ADVAIR) 500-50 MCG/DOSE AEPB Inhale 1 puff into the lungs 2 (two) times daily as needed (for breathing).    metoprolol succinate (TOPROL-XL) 50 MG 24 hr tablet Take 50 mg by mouth daily.    omeprazole (PRILOSEC) 40 MG capsule Take 80 mg by mouth at bedtime.    PARoxetine (PAXIL) 20 MG tablet Take 1 tablet (20 mg total) by mouth daily.    polyethylene glycol (MIRALAX / GLYCOLAX) 17 g packet Take 17 g by mouth daily.    pregabalin (LYRICA) 200 MG capsule Take 600 mg by mouth at bedtime. 12/29/2022: Patient verified she is taking 3 capsules at one time   rosuvastatin (CRESTOR) 20 MG tablet Take 1 tablet (20 mg total) by mouth daily.    amLODipine (NORVASC) 2.5 MG tablet Take 1 tablet (2.5 mg total) by mouth daily. (Patient not taking: Reported on 09/17/2023)    beclomethasone (QVAR) 80  MCG/ACT inhaler Inhale 2 puffs into the lungs 2 (two) times daily. (Patient not taking: Reported on 09/17/2023)    protriptyline (VIVACTIL) 10 MG tablet Take 10 mg by mouth at bedtime. (Patient not taking: Reported on 09/17/2023)    senna-docusate (SENOKOT-S) 8.6-50 MG tablet Take 2 tablets by mouth 2 (two) times daily. (Patient not taking: Reported on 09/17/2023)    No facility-administered encounter medications on file as of 09/17/2023.    PAST MEDICAL HISTORY: Past Medical History:  Diagnosis Date   Cancer [facility])    breast cancer   Hypertension    Thyroid disease     PAST SURGICAL HISTORY: Past Surgical History:  Procedure Laterality Date   ABDOMINAL HYSTERECTOMY     BACK SURGERY     JOINT REPLACEMENT     MASTECTOMY Bilateral    THYROIDECTOMY      ALLERGIES: Allergies  Allergen Reactions   Ace Inhibitors Other (See Comments)    Chest pain     FAMILY HISTORY: History reviewed. No pertinent family history.  SOCIAL HISTORY: Social History   Tobacco Use   Smoking status: Never  Smokeless tobacco: Never  Vaping Use   Vaping status: Never Used  Substance Use Topics   Alcohol use: No   Drug use: No   Social History   Social History Narrative   Are you right handed or left handed? Right   Are you currently employed ?    What is your current occupation? retired   Do you live at home alone? no   Who lives with you? Husband and family   What type of home do you live in: 1 story or 2 story? two    Caffeine none      Objective:  Vital Signs:  BP (!) 208/129   Pulse 76   Ht 5\' 6"  (1.676 m)   Wt 158 lb (71.7 kg)   SpO2 99%   BMI 25.50 kg/m   General: No acute distress.  Patient appears well-groomed.   Head:  Normocephalic/atraumatic Neck: supple Heart: regular rate and rhythm Lungs: Clear to auscultation bilaterally. Vascular: No carotid bruits.  Neurological Exam: Mental status: alert and oriented, speech fluent and not dysarthric, language  intact.  Cranial nerves: CN I: not tested CN II: pupils equal, round and reactive to light, visual fields intact CN III, IV, VI:  full range of motion, no nystagmus, no ptosis CN V: facial sensation intact. CN VII: upper and lower face symmetric CN VIII: hearing intact CN IX, X: uvula midline CN XI: sternocleidomastoid and trapezius muscles intact CN XII: tongue midline  Bulk & Tone: normal, no fasciculations. Motor:  muscle strength 5/5 throughout Deep Tendon Reflexes:  2+ throughout.   Sensation:  Pinprick sensation intact. Finger to nose testing:  Without dysmetria.   Gait:  Normal station and stride.  Labs and Imaging review: New results: 03/17/23: B12: 256 CRP wnl ESR wnl ANCA negative ANA negative  06/03/23 (external): CMP significant for Cr 1.23 Lipid panel: total cholesterol 191, LDL 131, TG 114  MRI/MRA brain and neck (03/30/23): FINDINGS: MRI HEAD FINDINGS   Brain: No acute infarct, mass effect or extra-axial collection. No acute or chronic hemorrhage. There is multifocal hyperintense T2-weighted signal within the white matter. Parenchymal volume and CSF spaces are normal. The midline structures are normal.   Vascular: Major flow voids are preserved.   Skull and upper cervical spine: Normal calvarium and skull base. Visualized upper cervical spine and soft tissues are normal.   Sinuses/Orbits:No paranasal sinus fluid levels or advanced mucosal thickening. No mastoid or middle ear effusion. Normal orbits.   MRA HEAD FINDINGS   POSTERIOR CIRCULATION:   --Vertebral arteries: Normal   --Inferior cerebellar arteries: Normal.   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 Otherwise normal.   --Superior cerebellar arteries: Normal.   --Posterior cerebral arteries: Normal. The left PCA is predominantly supplied by the posterior communicating artery.   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 Normal right ICA.   --Anterior cerebral arteries (ACA): Normal.   --Middle cerebral arteries (MCA): Normal.   MRA NECK FINDINGS   Aortic arch: Normal 3 vessel pattern   Right carotid system: Normal   Left carotid system: Normal   Vertebral arteries: Codominant and normal   Other: None   IMPRESSION: 1. No acute intracranial abnormality. 2. 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 3. Unchanged 3 mm aneurysm or infundibulum of a thrombosed left PCA P1 segment at the  distal basilar artery. 4. Mild chronic small vessel ischemic disease.  Previously reviewed results: Lab Results  Component Value Date    HGBA1C 5.7 (H) 12/29/2022      Recent Labs       Lab Results  Component Value Date    VITAMINB12 164 (L) 12/30/2022      Recent Labs[] Expand by Default       Lab Results  Component Value Date    TSH 0.687 12/29/2022      02/11/23: EtOH < 10 Urine drug screen: negative CBC unremarkable CMP unremarkable   12/30/22: Ammonia 38 (upper limit of normal 35) HIV nonreactive Lipid panel:   Component     Latest Ref Rng 12/29/2022  Cholesterol     0 - 200 mg/dL 811   Triglycerides     <150 mg/dL 914   HDL Cholesterol     >40 mg/dL 35 (L)   Total CHOL/HDL Ratio     RATIO 4.3   VLDL     0 - 40 mg/dL 23   LDL (calc)     0 - 99 mg/dL 93       External labs: CMP (10/22/22) unremarkable Vit D (03/19/22): 25   Imaging: MRI brain wo contrast (02/12/23): FINDINGS: Brain: No acutely restricted diffusion today on DWI. However there is a small new focus of T2 shine through in the left centrum semiovale on series 5, image 87.   Since February abnormal diffusion in the left splenium of the corpus callosum has faded (series 5, image 78), and a smaller area of T2 hyperintense gliosis remains (series 10, image 14).    Extensive T2 heterogeneity in the bilateral basal ganglia again noted, on FLAIR this appears in part due to extensive perivascular spaces. But there is superimposed Patchy and confluent abnormal deep white matter and deep gray matter FLAIR hyperintensity.   Nearby cystic encephalomalacia at the right genu of the corpus callosum is chronic and stable (series 9, image 13).   Similar T2 and FLAIR heterogeneity throughout the pons.   But there is no convincing cerebral cortical encephalomalacia identified, and no chronic cerebral blood products on SWI.   No midline shift, mass effect, evidence of mass lesion, ventriculomegaly, extra-axial collection or acute intracranial hemorrhage. Cervicomedullary junction and pituitary are within normal limits.   Vascular: Major intracranial vascular flow voids are stable since February.   Skull and upper cervical spine: Negative for age visible cervical spine. Visualized bone marrow signal is within normal limits.   Sinuses/Orbits: Globe motion artifact. Orbits and paranasal sinuses otherwise appear negative.   Other: Trace mastoid air cell fluid appears inconsequential. Grossly normal visible internal auditory structures. Negative visible scalp and face.   IMPRESSION: 1. No acute intracranial abnormality. 2. But a late subacute to early chronic white matter lacunar infarct in the posterior left centrum semiovale is new since February. Expected evolution of left splenium corpus callosum lacunar infarcts since that time. And underlying advanced chronic signal changes in the white matter (including elsewhere in the corpus callosum, bilateral deep gray matter, and pons. Underlying dilated perivascular spaces in the deep gray nuclei, such that this might be chronic hypertensive encephalopathy/small vessel disease. But other small vessel vasculopathies are not excluded.   CTA head and neck (02/11/23): FINDINGS: CT HEAD FINDINGS   Brain: No  evidence of acute infarct, hemorrhage, mass, mass effect, or midline shift. No hydrocephalus or extra-axial fluid collection. Hypodensity in the left splenium of the corpus callosum is more pronounced than on the  prior exam, consistent with evolving infarct. Other hypodensities involving the basal ganglia appear unchanged, consistent with remote lacunar infarcts.   Vascular: No hyperdense vessel.   Skull: Negative for fracture or focal lesion.   Sinuses/Orbits: No acute finding.   Other: The mastoid air cells are well aerated.   CTA NECK FINDINGS   Aortic arch: Two-vessel arch with a common origin of the brachiocephalic and left common carotid arteries. Imaged portion shows no evidence of aneurysm or dissection. No significant stenosis of the major arch vessel origins. Aortic atherosclerosis.   Right carotid system: No evidence of dissection, occlusion, or hemodynamically significant stenosis (greater than 50%).   Left carotid system: No evidence of dissection, occlusion, or hemodynamically significant stenosis (greater than 50%).   Vertebral arteries: No evidence of dissection, occlusion, or hemodynamically significant stenosis (greater than 50%).   Skeleton: No acute osseous abnormality. Degenerative changes in the cervical spine.   Other neck: Heterogeneity and enlargement of the right thyroid lobe, status post left thyroidectomy, with multiple nodules, 1 of which measures up to 2.4 cm. The thyroid was most recently evaluated with ultrasound on 01/31/2020.   Upper chest: No focal pulmonary opacity or pleural effusion.   Review of the MIP images confirms the above findings   CTA HEAD FINDINGS   Anterior circulation: Both internal carotid arteries are patent to the termini, without significant stenosis. 2 mm posteromedially directed outpouching from the right supraclinoid ICA (series 11, image 132 and series 12, image 96). 3 mm laterally directed outpouching from the  left supraclinoid ICA (series 11, image 128 and series 12, image 93).   A1 segments patent. Normal anterior communicating artery. Anterior cerebral arteries are patent to their distal aspects without significant stenosis.   No M1 stenosis or occlusion. MCA branches perfused to their distal aspects without significant stenosis.   Posterior circulation: Vertebral arteries patent to the vertebrobasilar junction without significant stenosis. Posterior inferior cerebellar arteries patent proximally.   Basilar patent to its distal aspect without significant stenosis. Again noted is a linear, 3 mm projection extending from the distal basilar artery (series 11, image 118), at the level of the takeoff of the right PCA, favored to represent a thrombosed left P1. Superior cerebellar arteries patent proximally.   Patent right P1. Fetal origin the left PCA from the left posterior communicating artery, which demonstrates mild stenosis at what was once likely the P1-P2 junction (series 11, image 124). No other significant stenosis in the left PCA. Moderate stenosis in the distal right P2. The right PCA is otherwise perfused to its distal aspect without significant stenosis. The right posterior communicating artery is not visualized.   Venous sinuses: As permitted by contrast timing, patent.   Anatomic variants: Fetal origin of the left PCA.   Review of the MIP images confirms the above findings   IMPRESSION: 1. No acute intracranial process. Evolving infarct in the left splenium of the corpus callosum. 2. No intracranial large vessel occlusion. Moderate stenosis in the distal right P2. 3. No hemodynamically significant stenosis in the neck. 4. 2 mm posteromedially directed outpouching from the right supraclinoid ICA, and 3 mm laterally directed outpouching from the left supraclinoid ICA, concerning for aneurysms. 5. Linear projection extending from the distal basilar artery, at the level  of the takeoff of the right PCA, is favored to represent a thrombosed left P1. 6. Fetal origin of the left PCA, with mild stenosis at what was once likely the P1-P2 junction. 7. Aortic atherosclerosis.   MRA head (  12/30/22): FINDINGS: Anterior circulation: Bilateral intracranial ICAs,, MCAs, and ACAs are patent without proximal hemodynamically significant stenosis.   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   Posterior circulation: There is a tubular 4 x 2 mm outpouching arising from the basilar tip anteriorly, which is favored to represent a thrombosed P1 PCA given suggestion of a vessel arising from the tip on reformats and the tubular appearance. The left posterior communicating artery supplies the left posterior cerebral artery which is otherwise patent. Moderate right P2 PCA stenosis. Bilateral intradural vertebral arteries and basilar artery are patent without significant stenosis.   IMPRESSION: 1. Approximately 4 x 2 tubular outpouching rising from the basilar tip is favored to represent a thrombosed left P1 PCA. The left posterior communicating artery supplies the left posterior cerebral artery which is otherwise patent. 2. Moderate right distal P2 PCA stenosis. 3. 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   MRI brain w/wo contrast (12/29/22 and 12/30/22): FINDINGS: The coronal T2-weighted sequence is moderately motion degraded.   Brain:   No age advanced or lobar predominant parenchymal atrophy.   8 mm T2 FLAIR hyperintense lesion within the left aspect of the callosal splenium with circumferential restricted diffusion at its periphery (series 2, image 30) (series 6, image 21).   3 mm focus of restricted diffusion within the right lentiform nucleus, compatible with an acute/subacute lacunar infarct.   T2 hyperintense foci within the bilateral corona  radiata/basal ganglia, likely reflecting a combination of chronic lacunar infarcts and prominent perivascular spaces.   9 mm chronic infarct right aspect of the callosal genu.   Moderate multifocal T2 FLAIR hyperintense signal abnormality elsewhere within the cerebral white matter, nonspecific but compatible with chronic small vessel ischemic disease.   Chronic lacunar infarct within the right thalamus.   Chronic lacunar infarct within the left aspect of the pons. Background moderate pontine chronic small vessel ischemic disease.   Small chronic infarct within the left cerebellar hemisphere.   No chronic intracranial blood products.   No extra-axial fluid collection.   No midline shift.   Vascular: Maintained flow voids within the proximal large arterial vessels.   Skull and upper cervical spine: No focal suspicious marrow lesion.   Sinuses/Orbits: No mass or acute finding within the imaged orbits. No significant paranasal sinus disease.   IMPRESSION: 1. 8 mm T2 FLAIR hyperintense lesion within the left aspect of the callosal splenium with circumferential restricted diffusion at its periphery. This may reflect a subacute infarct. However, post-contrast MR imaging of the brain is recommended to exclude an intracranial metastasis. 2. 3 mm acute/early subacute lacunar infarct within the right lentiform nucleus. 3. 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   FINDINGS: Postcontrast imaging was obtained to further evaluate the abnormality seen on same day MRI head. The lesion in the left aspect of the splenium of the corpus callosum does not demonstrate significant enhancement. No pathologic enhancement elsewhere.   IMPRESSION: The lesion in the left aspect of the splenium of the corpus callosum does not demonstrate significant enhancement and is not compatible with a metastasis.   Echo (12/30/22):  1. Left  ventricular ejection fraction, by estimation, is 55 to 60%. The  left ventricle has normal function. The left ventricle has no regional  wall motion abnormalities. There is mild concentric left ventricular  hypertrophy. Left ventricular diastolic  parameters are consistent with Grade I diastolic  dysfunction (impaired  relaxation).   2. Right ventricular systolic function is normal. The right ventricular  size is normal.   3. The mitral valve is normal in structure. Trivial mitral valve  regurgitation. No evidence of mitral stenosis.   4. The aortic valve was not well visualized. Aortic valve regurgitation  is mild. No aortic stenosis is present.   5. The inferior vena cava is normal in size with greater than 50%  respiratory variability, suggesting right atrial pressure of 3 mmHg.  Left [facility]: Left atrial size was normal in size.    Holter monitor (30 days) 02/18/23: Sinus rhythm average heartbeat 75 bpm   No atrial fibrillation   Brief 9 beat run of nonsustained ventricular tachycardia.  Asymptomatic.   Rare PVCs and PACs.  Assessment/Plan:  This is Haneen Ratts, a 72 y.o. female with a history of stroke (left centrum semiovale, left splenium corpus collosum, pons) and extensive chronic microvascular ischemia. Given her very high BP today (SBP > 200), the likely etiology is poorly controlled risk factors such as HTN. Her LDL was recently 130s despite being on Crestor 40 mg. I am concerned about medication non-adherence. She also has a lot of stressors given that she is taking care of her husband. Labs have also been significant for B12 and vit D deficiencies. Despite her very high BP today, she is doing well with no clear neurologic deficits.  Plan: -Continue B12 supplementation 1000 mcg daily -Continue Vitamin D supplementation 1000 internation units (IU) daily -Continue Plavix 75 mg daily -Continue Crestor 20 mg daily  -Sleep medicine referral for narcolepsy -Discussed repeating  MRI and CTA, but given no new symptoms and very high BP today, this likely explains disease burden seen, so will hold off for now -Discuss with PCP about BP. We discussed going to nearest ED if she has any symptoms, including but not limited to HA or vision changes or focal neurologic deficits. -Discussed the importance of controlling risk factors and stress. -Discussed stroke warning signs  Return to clinic in 6 months  Total time spent reviewing records, interview, history/exam, documentation, and coordination of care on day of encounter:  35 min  Jacquelyne Balint, MD

## 2023-09-17 ENCOUNTER — Ambulatory Visit: Payer: Medicare HMO | Admitting: Neurology

## 2023-09-17 ENCOUNTER — Encounter: Payer: Self-pay | Admitting: Neurology

## 2023-09-17 VITALS — BP 210/80 | HR 76 | Ht 66.0 in | Wt 158.0 lb

## 2023-09-17 DIAGNOSIS — E538 Deficiency of other specified B group vitamins: Secondary | ICD-10-CM

## 2023-09-17 DIAGNOSIS — E559 Vitamin D deficiency, unspecified: Secondary | ICD-10-CM | POA: Diagnosis not present

## 2023-09-17 DIAGNOSIS — I10 Essential (primary) hypertension: Secondary | ICD-10-CM

## 2023-09-17 DIAGNOSIS — R413 Other amnesia: Secondary | ICD-10-CM | POA: Diagnosis not present

## 2023-09-17 DIAGNOSIS — I63332 Cerebral infarction due to thrombosis of left posterior cerebral artery: Secondary | ICD-10-CM

## 2023-09-17 DIAGNOSIS — G47419 Narcolepsy without cataplexy: Secondary | ICD-10-CM

## 2023-09-17 NOTE — Patient Instructions (Addendum)
I saw you today for stroke follow up. Your blood pressure was very high today. If you have headache or vision changes, you should go to the closest emergency room.  If your blood pressure is high at home tonight, you should call your primary care doctor to discuss to see if medications need to be adjusted.  Continue Plavix 75 mg daily.  Continue Crestor 20 mg daily.  Continue B12 and vitamin D supplementation.  If you have new difficulty speaking, face droop, numbness on one side of the body, weakness on one side of the body, or dizziness/imbalance, this could be the sign of a stroke. Don't wait, please call EMS and be evaluated at the nearest emergency room.  The physicians and staff at [facility] are committed to providing excellent care. You may receive a survey requesting feedback about your experience at our office. We strive to receive "very good" responses to the survey questions. If you feel that your experience would prevent you from giving the office a "very good " response, please contact our office to try to remedy the situation. We may be reached at [phone]. Thank you for taking the time out of your busy day to complete the survey.  Jacquelyne Balint, MD [facility]

## 2023-09-17 NOTE — Addendum Note (Signed)
Addended by: Lenise Herald on: 09/17/2023 02:43 PM   Modules accepted: Orders

## 2023-09-22 ENCOUNTER — Other Ambulatory Visit: Payer: Self-pay

## 2023-09-25 ENCOUNTER — Emergency Department (HOSPITAL_BASED_OUTPATIENT_CLINIC_OR_DEPARTMENT_OTHER)
Admission: EM | Admit: 2023-09-25 | Discharge: 2023-09-25 | Disposition: A | Discharge status: Home / Self Care | Payer: Medicare HMO | Attending: Emergency Medicine | Admitting: Emergency Medicine

## 2023-09-25 ENCOUNTER — Other Ambulatory Visit: Payer: Self-pay

## 2023-09-25 ENCOUNTER — Encounter (HOSPITAL_BASED_OUTPATIENT_CLINIC_OR_DEPARTMENT_OTHER): Payer: Self-pay

## 2023-09-25 DIAGNOSIS — E039 Hypothyroidism, unspecified: Secondary | ICD-10-CM | POA: Insufficient documentation

## 2023-09-25 DIAGNOSIS — Z79899 Other long term (current) drug therapy: Secondary | ICD-10-CM | POA: Insufficient documentation

## 2023-09-25 DIAGNOSIS — Z7901 Long term (current) use of anticoagulants: Secondary | ICD-10-CM | POA: Diagnosis not present

## 2023-09-25 DIAGNOSIS — Z853 Personal history of malignant neoplasm of breast: Secondary | ICD-10-CM | POA: Diagnosis not present

## 2023-09-25 DIAGNOSIS — I10 Essential (primary) hypertension: Secondary | ICD-10-CM | POA: Diagnosis present

## 2023-09-25 DIAGNOSIS — E876 Hypokalemia: Secondary | ICD-10-CM | POA: Diagnosis not present

## 2023-09-25 HISTORY — DX: Cerebral infarction, unspecified: I63.9

## 2023-09-25 LAB — CBC WITH DIFFERENTIAL/PLATELET
Abs Immature Granulocytes: 0.02 10*3/uL (ref 0.00–0.07)
Basophils Absolute: 0 10*3/uL (ref 0.0–0.1)
Basophils Relative: 0 %
Eosinophils Absolute: 0.2 10*3/uL (ref 0.0–0.5)
Eosinophils Relative: 3 %
HCT: 39.1 % (ref 36.0–46.0)
Hemoglobin: 13.2 g/dL (ref 12.0–15.0)
Immature Granulocytes: 0 %
Lymphocytes Relative: 16 %
Lymphs Abs: 1 10*3/uL (ref 0.7–4.0)
MCH: 28.6 pg (ref 26.0–34.0)
MCHC: 33.8 g/dL (ref 30.0–36.0)
MCV: 84.6 fL (ref 80.0–100.0)
Monocytes Absolute: 0.6 10*3/uL (ref 0.1–1.0)
Monocytes Relative: 10 %
Neutro Abs: 4.3 10*3/uL (ref 1.7–7.7)
Neutrophils Relative %: 71 %
Platelets: 164 10*3/uL (ref 150–400)
RBC: 4.62 MIL/uL (ref 3.87–5.11)
RDW: 13.5 % (ref 11.5–15.5)
WBC: 6 10*3/uL (ref 4.0–10.5)
nRBC: 0 % (ref 0.0–0.2)

## 2023-09-25 LAB — BASIC METABOLIC PANEL
Anion gap: 6 (ref 5–15)
BUN: 18 mg/dL (ref 8–23)
CO2: 29 mmol/L (ref 22–32)
Calcium: 8.8 mg/dL — ABNORMAL LOW (ref 8.9–10.3)
Chloride: 101 mmol/L (ref 98–111)
Creatinine, Ser: 1.09 mg/dL — ABNORMAL HIGH (ref 0.44–1.00)
GFR, Estimated: 55 mL/min — ABNORMAL LOW (ref >60)
Glucose, Bld: 109 mg/dL — ABNORMAL HIGH (ref 70–99)
Potassium: 3.2 mmol/L — ABNORMAL LOW (ref 3.5–5.1)
Sodium: 136 mmol/L (ref 135–145)

## 2023-09-25 MED ORDER — POTASSIUM CHLORIDE CRYS ER 20 MEQ PO TBCR
40.0000 meq | EXTENDED_RELEASE_TABLET | Freq: Once | ORAL | Status: AC
  Administered 2023-09-25: 40 meq via ORAL
  Filled 2023-09-25: qty 2

## 2023-09-25 MED ORDER — CLONIDINE HCL 0.1 MG PO TABS
0.2000 mg | ORAL_TABLET | Freq: Once | ORAL | Status: AC
  Administered 2023-09-25: 0.2 mg via ORAL
  Filled 2023-09-25: qty 2

## 2023-09-25 NOTE — ED Triage Notes (Addendum)
Pt reports her BP was 211/87 today around 1800. It has been up and down all week. She takes Clonidine and Metoprolol, which she did take today. Hx of a stroke in February, no deficits. She takes Plavix. Denies any pain currently but she hasn't slept in 3 nights. Hx of Narcolepsy.

## 2023-09-25 NOTE — ED Provider Notes (Addendum)
[facility] Provider Note   CSN: [id number] Arrival date & time: 09/25/23  4098     History  Chief Complaint  Patient presents with   Hypertension    Cheryl Levy is a 72 y.o. female.  Patient here tonight worried about her blood pressure.  Is been running about 200 all day today and was running high on Friday.  Patient did see her primary care doctor on Wednesday and she was running at that time at about 197 systolic.  Her doctor said for her to sort a keep a record of her blood pressures at home and then they were trended and follow-up.  Patient denies any severe headache any chest pain trouble breathing or any strokelike cyst symptoms.  Patient does have a history of stroke had a stroke in February is hypertension hyperlipidemia B12 deficiency history of left breast cancer asthma gastroesophageal reflux disease hypothyroidism anxiety depression and narcolepsy fibromyalgia chronic pain syndrome and migraine patient was last seen by neurology on November 8.  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  Patient is on clonidine and Toprol XL for blood pressure control.  Patient's blood pressure is normally around 160 systolic.       Home Medications Prior to Admission medications   Medication Sig Start Date End Date Taking? Authorizing Provider  amLODipine (NORVASC) 2.5 MG tablet Take 1 tablet (2.5 mg total) by mouth daily. Patient not taking: Reported on 09/17/2023 12/30/22   Levin Erp, MD  beclomethasone (QVAR) 80 MCG/ACT inhaler Inhale 2 puffs into the lungs 2 (two) times daily. Patient not taking: Reported on 09/17/2023 05/23/14   Benjiman Core, MD  cloNIDine (CATAPRES) 0.2 MG tablet Take 0.2 mg by mouth 2 (two) times daily.    [provider]  clopidogrel (PLAVIX) 75 MG tablet Take 75 mg by mouth daily.    Antony Madura, MD  cyanocobalamin  1000 MCG tablet Take 1 tablet (1,000 mcg total) by mouth daily. 12/31/22   Levin Erp, MD  DULoxetine (CYMBALTA) 60 MG capsule Take 120 mg by mouth daily. 02/03/17   [provider]  Fluticasone-Salmeterol (ADVAIR) 500-50 MCG/DOSE AEPB Inhale 1 puff into the lungs 2 (two) times daily as needed (for breathing).    [provider]  metoprolol succinate (TOPROL-XL) 50 MG 24 hr tablet Take 50 mg by mouth daily.    [provider]  omeprazole (PRILOSEC) 40 MG capsule Take 80 mg by mouth at bedtime.    [provider]  PARoxetine (PAXIL) 20 MG tablet Take 1 tablet (20 mg total) by mouth daily. 12/31/22   Levin Erp, MD  polyethylene glycol (MIRALAX / GLYCOLAX) 17 g packet Take 17 g by mouth daily. 04/02/23   Osvaldo Shipper, MD  pregabalin (LYRICA) 200 MG capsule Take 600 mg by mouth at bedtime.    [provider]  protriptyline (VIVACTIL) 10 MG tablet Take 10 mg by mouth at bedtime. Patient not taking: Reported on 09/17/2023 05/19/17   [provider]  rosuvastatin (CRESTOR) 20 MG tablet Take 1 tablet (20 mg total) by mouth daily. 12/31/22   Levin Erp, MD  senna-docusate (SENOKOT-S) 8.6-50 MG tablet Take 2 tablets by mouth 2 (two) times daily. Patient not taking: Reported on 09/17/2023 04/01/23   Osvaldo Shipper, MD      Allergies    Ace inhibitors and Oxycodone    Review  of Systems   Review of Systems  Constitutional:  Negative for chills and fever.  HENT:  Negative for ear pain and sore throat.   Eyes:  Negative for pain and visual disturbance.  Respiratory:  Negative for cough and shortness of breath.   Cardiovascular:  Negative for chest pain and palpitations.  Gastrointestinal:  Negative for abdominal pain and vomiting.  Genitourinary:  Negative for dysuria and hematuria.  Musculoskeletal:  Negative for arthralgias and back pain.  Skin:  Negative for color change and rash.  Neurological:  Negative for dizziness, seizures,  syncope, facial asymmetry, speech difficulty, weakness, numbness and headaches.  All other systems reviewed and are negative.   Physical Exam Updated Vital Signs BP (!) 201/73   Pulse (!) 53   Temp 98.6 F (37 C) (Oral)   Resp 17   Ht 1.676 m (5\' 6" )   Wt 71.7 kg   SpO2 95%   BMI 25.50 kg/m  Physical Exam Vitals and nursing note reviewed.  Constitutional:      General: She is not in acute distress.    Appearance: Normal appearance. She is well-developed.  HENT:     Head: Normocephalic and atraumatic.  Eyes:     Extraocular Movements: Extraocular movements intact.     Conjunctiva/sclera: Conjunctivae normal.     Pupils: Pupils are equal, round, and reactive to light.  Cardiovascular:     Rate and Rhythm: Normal rate and regular rhythm.     Heart sounds: No murmur heard. Pulmonary:     Effort: Pulmonary effort is normal. No respiratory distress.     Breath sounds: Normal breath sounds.  Abdominal:     Palpations: Abdomen is soft.     Tenderness: There is no abdominal tenderness.  Musculoskeletal:        General: No swelling.     Cervical back: Normal range of motion and neck supple.     Right lower leg: No edema.     Left lower leg: No edema.  Skin:    General: Skin is warm and dry.     Capillary Refill: Capillary refill takes less than 2 seconds.  Neurological:     General: No focal deficit present.     Mental Status: She is alert and oriented to person, place, and time.     Cranial Nerves: No cranial nerve deficit.     Sensory: No sensory deficit.     Motor: No weakness.  Psychiatric:        Mood and Affect: Mood normal.     ED Results / Procedures / Treatments   Labs (all labs ordered are listed, but only abnormal results are displayed) Labs Reviewed  CBC WITH DIFFERENTIAL/PLATELET  BASIC METABOLIC PANEL    EKG EKG Interpretation Date/Time:  Saturday September 25 2023 19:05:23 EST Ventricular Rate:  51 PR Interval:  151 QRS Duration:  99 QT  Interval:  442 QTC Calculation: 408 R Axis:   8  Text Interpretation: Sinus rhythm LVH with secondary repolarization abnormality Anterior Q waves, possibly due to LVH No significant change since last tracing Confirmed by Vanetta Mulders [phone]) on 09/25/2023 7:21:22 PM  Radiology No results found.  Procedures Procedures    Medications Ordered in ED Medications  cloNIDine (CATAPRES) tablet 0.2 mg (has no administration in time range)    ED Course/ Medical Decision Making/ A&P  Medical Decision Making Amount and/or Complexity of Data Reviewed Labs: ordered.  Risk Prescription drug management.   Patient's blood pressure here 206/88.  So mostly systolic hypertension.  Patient normally takes her clonidine in the evening so we will give her a dose of clonidine here and get basic labs.  Patient without headache chest pain shortness of breath or any strokelike symptoms.  Do not feel that this is hypertensive emergency.  Feel that patient probably can continue to trend her blood pressure with daily checks keep a log and contact her primary care doctor on Monday to see what the trends are and see if adjustments are needed.  Patient knows to return for any new or worse symptoms.  Or certainly for any strokelike symptoms.  Patient's CBC is normal.  Basic metabolic panel potassium a little low at 3.2.  Will give 40 mill close a potassium here.  Patient's GFR is 55 but is actually improved from her baseline.   In addition patient's blood pressure with the clonidine is improved to 147/51.  Patient does not have evidence of hypertensive emergency.  Will have her trend her blood pressures and follow-up with her primary care doctor.  Final Clinical Impression(s) / ED Diagnoses Final diagnoses:  Primary hypertension    Rx / DC Orders ED Discharge Orders     None         Vanetta Mulders, MD 09/25/23 1954    Vanetta Mulders, MD 09/25/23  2047

## 2023-09-25 NOTE — Discharge Instructions (Signed)
Keep blood pressure log at home.  Check it daily around 2:00 in the afternoon and record the blood pressure.  Make an appointment follow-up with your regular doctor.  Also have them recheck your potassium was a little low here today.  You were given an oral potassium supplement here prior to leaving.  Return for severe headache any strokelike symptoms trouble breathing chest pain.

## 2023-10-20 ENCOUNTER — Emergency Department (HOSPITAL_BASED_OUTPATIENT_CLINIC_OR_DEPARTMENT_OTHER)
Admission: EM | Admit: 2023-10-20 | Discharge: 2023-10-20 | Disposition: A | Discharge status: Home / Self Care | Payer: Medicare HMO | Attending: Emergency Medicine | Admitting: Emergency Medicine

## 2023-10-20 ENCOUNTER — Other Ambulatory Visit: Payer: Self-pay

## 2023-10-20 ENCOUNTER — Emergency Department (HOSPITAL_BASED_OUTPATIENT_CLINIC_OR_DEPARTMENT_OTHER): Payer: Medicare HMO

## 2023-10-20 ENCOUNTER — Encounter (HOSPITAL_BASED_OUTPATIENT_CLINIC_OR_DEPARTMENT_OTHER): Payer: Self-pay | Admitting: Urology

## 2023-10-20 DIAGNOSIS — S40911A Unspecified superficial injury of right shoulder, initial encounter: Secondary | ICD-10-CM | POA: Diagnosis present

## 2023-10-20 DIAGNOSIS — W06XXXA Fall from bed, initial encounter: Secondary | ICD-10-CM | POA: Insufficient documentation

## 2023-10-20 DIAGNOSIS — Z79899 Other long term (current) drug therapy: Secondary | ICD-10-CM | POA: Insufficient documentation

## 2023-10-20 DIAGNOSIS — I129 Hypertensive chronic kidney disease with stage 1 through stage 4 chronic kidney disease, or unspecified chronic kidney disease: Secondary | ICD-10-CM | POA: Diagnosis not present

## 2023-10-20 DIAGNOSIS — N183 Chronic kidney disease, stage 3 unspecified: Secondary | ICD-10-CM | POA: Insufficient documentation

## 2023-10-20 DIAGNOSIS — Z853 Personal history of malignant neoplasm of breast: Secondary | ICD-10-CM | POA: Insufficient documentation

## 2023-10-20 DIAGNOSIS — J4530 Mild persistent asthma, uncomplicated: Secondary | ICD-10-CM | POA: Insufficient documentation

## 2023-10-20 DIAGNOSIS — S4991XA Unspecified injury of right shoulder and upper arm, initial encounter: Secondary | ICD-10-CM

## 2023-10-20 MED ORDER — ACETAMINOPHEN 325 MG PO TABS: 650.0000 mg | ORAL_TABLET | Freq: Four times a day (QID) | ORAL | 0 refills | Status: AC | PRN

## 2023-10-20 MED ORDER — IBUPROFEN 600 MG PO TABS: 600.0000 mg | ORAL_TABLET | Freq: Four times a day (QID) | ORAL | 0 refills | Status: AC | PRN

## 2023-10-20 MED ORDER — DICLOFENAC SODIUM 1 % EX GEL: 2.0000 g | Freq: Three times a day (TID) | CUTANEOUS | 0 refills | Status: AC | PRN

## 2023-10-20 NOTE — ED Triage Notes (Signed)
Pt states fall out of the bed 2 nights ago and fell onto right shoulder  Normal ROM but pain with extension  Denies head injury with fall   Using salon pas and its helping  Takes plavix

## 2023-10-20 NOTE — ED Provider Notes (Signed)
[place] [facility] Provider Note  CSN: [id number] Arrival date & time: 10/20/23 1949  Chief Complaint(s) Shoulder Injury  HPI Cheryl Levy is a 72 y.o. female with past medical history as below, significant for breast cancer, hypertension, CVA, thyroid disease, CKD, hypertension, RLS who presents to the ED with complaint of right shoulder injury.  She reports on Monday she slipped out of bed onto her right shoulder.  Some pain to the lateral aspect of her right shoulder.  No head injury or head or neck pain.  No chest pain.  No numbness or tingling to extremities.  She has been ambulatory since then.  Taking Motrin intermittently which has helped with her pain.  No syncope, no palpitations.  Apparent mechanical fall from her bed  Past Medical History Past Medical History:  Diagnosis Date   Cancer [facility])    breast cancer   Hypertension    Stroke (cerebrum) [facility])    Thyroid disease    Patient Active Problem List   Diagnosis Date Noted   Acute renal failure superimposed on stage 3a chronic kidney disease (HCC) 03/30/2023   Mild persistent asthma 03/30/2023   Depression with anxiety 03/30/2023   Chronic pain 03/30/2023   History of ischemic stroke 12/29/2022   Creatinine elevation 12/29/2022   HTN (hypertension) 12/29/2022   Difficulty walking 12/29/2022   History of breast cancer 12/29/2022   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 Authorizing Provider  amLODipine (NORVASC) 2.5 MG tablet Take 1 tablet (2.5 mg total) by mouth daily. Patient not taking: Reported on 09/17/2023 12/30/22   Levin Erp, MD  beclomethasone (QVAR) 80 MCG/ACT inhaler Inhale 2 puffs into the lungs 2 (two) times daily. Patient not taking: Reported on 09/17/2023 05/23/14   Benjiman Core, MD  cloNIDine (CATAPRES) 0.2 MG tablet Take 0.2 mg by mouth 2 (two) times daily.     [provider]  clopidogrel (PLAVIX) 75 MG tablet Take 75 mg by mouth daily.    Antony Madura, MD  cyanocobalamin 1000 MCG tablet Take 1 tablet (1,000 mcg total) by mouth daily. 12/31/22   Levin Erp, MD  DULoxetine (CYMBALTA) 60 MG capsule Take 120 mg by mouth daily. 02/03/17   [provider]  Fluticasone-Salmeterol (ADVAIR) 500-50 MCG/DOSE AEPB Inhale 1 puff into the lungs 2 (two) times daily as needed (for breathing).    [provider]  metoprolol succinate (TOPROL-XL) 50 MG 24 hr tablet Take 50 mg by mouth daily.    [provider]  omeprazole (PRILOSEC) 40 MG capsule Take 80 mg by mouth at bedtime.    [provider]  PARoxetine (PAXIL) 20 MG tablet Take 1 tablet (20 mg total) by mouth daily. 12/31/22   Levin Erp, MD  polyethylene glycol (MIRALAX / GLYCOLAX) 17 g packet Take 17 g by mouth daily. 04/02/23   Osvaldo Shipper, MD  pregabalin (LYRICA) 200 MG capsule Take 600 mg by mouth at bedtime.    [provider]  protriptyline (VIVACTIL) 10 MG tablet Take 10 mg by mouth at bedtime. Patient not taking: Reported on 09/17/2023 05/19/17   [provider]  rosuvastatin (CRESTOR) 20 MG tablet Take 1 tablet (20 mg total) by mouth daily. 12/31/22   Levin Erp, MD  senna-docusate (SENOKOT-S) 8.6-50 MG tablet Take 2 tablets by mouth 2 (two) times daily. Patient not taking: Reported on 09/17/2023 04/01/23   Osvaldo Shipper, MD  Past Surgical History Past Surgical History:  Procedure Laterality Date   ABDOMINAL HYSTERECTOMY     BACK SURGERY     JOINT REPLACEMENT     MASTECTOMY Bilateral    THYROIDECTOMY     Family History History reviewed. No pertinent family history.  Social History Social History   Tobacco Use   Smoking status: Never   Smokeless tobacco: Never  Vaping Use   Vaping  status: Never Used  Substance Use Topics   Alcohol use: No   Drug use: No   Allergies Ace inhibitors and Oxycodone  Review of Systems Review of Systems  Constitutional:  Negative for chills and fever.  Respiratory:  Negative for chest tightness.   Cardiovascular:  Negative for chest pain.  Gastrointestinal:  Negative for abdominal pain, nausea and vomiting.  Musculoskeletal:  Positive for arthralgias. Negative for gait problem.  Skin:        Bruise shoulder R  Neurological:  Negative for syncope, light-headedness and headaches.  All other systems reviewed and are negative.   Physical Exam Vital Signs  I have reviewed the triage vital signs BP (!) 145/73 (BP Location: Right Arm)   Pulse (!) 54   Temp 97.7 F (36.5 C) (Oral)   Resp 16   Ht 5\' 6"  (1.676 m)   Wt 71.6 kg   SpO2 97%   BMI 25.48 kg/m  Physical Exam Vitals and nursing note reviewed.  Constitutional:      General: She is not in acute distress.    Appearance: Normal appearance.  HENT:     Head: Normocephalic and atraumatic. No raccoon eyes, Battle's sign, right periorbital erythema or left periorbital erythema.     Jaw: There is normal jaw occlusion. No trismus.     Right Ear: External ear normal.     Left Ear: External ear normal.     Nose: Nose normal.     Mouth/Throat:     Mouth: Mucous membranes are moist.  Eyes:     General: No scleral icterus.       Right eye: No discharge.        Left eye: No discharge.  Cardiovascular:     Rate and Rhythm: Normal rate and regular rhythm.     Pulses: Normal pulses.     Heart sounds: Normal heart sounds.  Pulmonary:     Effort: Pulmonary effort is normal. No respiratory distress.     Breath sounds: Normal breath sounds. No stridor.  Abdominal:     General: Abdomen is flat. There is no distension.     Palpations: Abdomen is soft.     Tenderness: There is no abdominal tenderness.  Musculoskeletal:       Arms:     Cervical back: Full passive range of motion  without pain. No rigidity. No pain with movement.     Right lower leg: No edema.     Left lower leg: No edema.     Comments: Bruise to lateral aspect of right deltoid.  Full range of motion to right upper extremity.  Right upper extremities NVI.  Strength symmetric 5/5 bilateral upper extremities.  Full range of motion to her neck.  No evidence of head trauma EXTR  Skin:    General: Skin is warm and dry.     Capillary Refill: Capillary refill takes less than 2 seconds.  Neurological:     Mental Status: She is alert.  Psychiatric:        Mood and Affect: Mood normal.  Behavior: Behavior normal. Behavior is cooperative.     ED Results and Treatments Labs (all labs ordered are listed, but only abnormal results are displayed) Labs Reviewed - No data to display                                                                                                                        Radiology DG Shoulder Right  Result Date: 10/20/2023 CLINICAL DATA:  Fall out of bed 2 nights ago with right shoulder pain, initial encounter EXAM: RIGHT SHOULDER - 2+ VIEW COMPARISON:  None Available. FINDINGS: Mild degenerative changes of the acromioclavicular and glenohumeral joints are seen. No acute fracture or dislocation is noted. No soft tissue abnormality is seen. IMPRESSION: No acute abnormality noted.  Mild degenerative changes are noted. Electronically Signed   By: Alcide Clever M.D.   On: 10/20/2023 20:52    Pertinent labs & imaging results that were available during my care of the patient were reviewed by me and considered in my medical decision making (see MDM for details).  Medications Ordered in ED Medications - No data to display                                                                                                                                   Procedures Procedures  (including critical care time)  Medical Decision Making / ED Course    Medical Decision Making:    Azalaya Shivley is a 72 y.o. female with past medical history as below, significant for breast cancer, hypertension, CVA, thyroid disease, CKD, hypertension, RLS who presents to the ED with complaint of right shoulder injury.. The complaint involves an extensive differential diagnosis and also carries with it a high risk of complications and morbidity.  Serious etiology was considered. Ddx includes but is not limited to: Strain, sprain, dislocation, soft tissue injury  Complete initial physical exam performed, notably the patient was in NAD.    Reviewed and confirmed nursing documentation for past medical history, family history, social history.  Vital signs reviewed.         Brief summary: 72 yo female here with right shoulder, bruise noted to right shoulder.  Full range of motion to right upper extremity.  Right upper extremity is NVI.  No evidence of head trauma.  She denies head trauma.  X-ray unremarkable.  There are likely contusion, supportive care at home.  Activity restrictions  discussed with patient.  Follow-up PCP   The patient improved significantly and was discharged in stable condition. Detailed discussions were had with the patient regarding current findings, and need for close f/u with PCP or on call doctor. The patient has been instructed to return immediately if the symptoms worsen in any way for re-evaluation. Patient verbalized understanding and is in agreement with current care plan. All questions answered prior to discharge.                  Additional history obtained: -Additional history obtained from na -External records from outside source obtained and reviewed including: Chart review including previous notes, labs, imaging, consultation notes including  Primary care documentation Admission    Lab Tests: na  EKG   EKG Interpretation Date/Time:    Ventricular Rate:    PR Interval:    QRS Duration:    QT Interval:    QTC Calculation:   R Axis:       Text Interpretation:           Imaging Studies ordered: I ordered imaging studies including shoulder xr I independently visualized the following imaging with scope of interpretation limited to determining acute life threatening conditions related to emergency care; findings noted above I independently visualized and interpreted imaging. I agree with the radiologist interpretation   Medicines ordered and prescription drug management: No orders of the defined types were placed in this encounter.   -I have reviewed the patients home medicines and have made adjustments as needed   Consultations Obtained: na   Cardiac Monitoring: Continuous pulse oximetry interpreted by myself, 99% on RA.    Social Determinants of Health:  Diagnosis or treatment significantly limited by social determinants of health: na   Reevaluation: After the interventions noted above, I reevaluated the patient and found that they have improved  Co morbidities that complicate the patient evaluation  Past Medical History:  Diagnosis Date   Cancer (HCC)    breast cancer   Hypertension    Stroke (cerebrum) (HCC)    Thyroid disease       Dispostion: Disposition decision including need for hospitalization was considered, and patient discharged from emergency department.    Final Clinical Impression(s) / ED Diagnoses Final diagnoses:  None        Sloan Leiter, DO 10/20/23 2331

## 2023-10-20 NOTE — ED Notes (Signed)
Patient transported to X-ray 

## 2023-10-20 NOTE — Discharge Instructions (Addendum)
It was a pleasure caring for you today in the emergency department.  No heavy lifting right upper extremity, avoid strenuous activity right upper extremity.  Follow-up PCP.  Please return to the emergency department for any worsening or worrisome symptoms.

## 2023-11-06 ENCOUNTER — Encounter (HOSPITAL_BASED_OUTPATIENT_CLINIC_OR_DEPARTMENT_OTHER): Payer: Self-pay

## 2023-11-06 ENCOUNTER — Other Ambulatory Visit: Payer: Self-pay

## 2023-11-06 ENCOUNTER — Inpatient Hospital Stay (HOSPITAL_BASED_OUTPATIENT_CLINIC_OR_DEPARTMENT_OTHER)
Admission: EM | Admit: 2023-11-06 | Discharge: 2023-11-08 | DRG: 305 | Disposition: A | Discharge status: Home / Self Care | Payer: Medicare HMO | Attending: Internal Medicine | Admitting: Internal Medicine

## 2023-11-06 ENCOUNTER — Emergency Department (HOSPITAL_BASED_OUTPATIENT_CLINIC_OR_DEPARTMENT_OTHER): Payer: Medicare HMO

## 2023-11-06 DIAGNOSIS — K219 Gastro-esophageal reflux disease without esophagitis: Secondary | ICD-10-CM | POA: Diagnosis present

## 2023-11-06 DIAGNOSIS — Z7951 Long term (current) use of inhaled steroids: Secondary | ICD-10-CM | POA: Diagnosis not present

## 2023-11-06 DIAGNOSIS — F3342 Major depressive disorder, recurrent, in full remission: Secondary | ICD-10-CM | POA: Diagnosis present

## 2023-11-06 DIAGNOSIS — Z8673 Personal history of transient ischemic attack (TIA), and cerebral infarction without residual deficits: Secondary | ICD-10-CM | POA: Diagnosis not present

## 2023-11-06 DIAGNOSIS — G47419 Narcolepsy without cataplexy: Secondary | ICD-10-CM | POA: Diagnosis present

## 2023-11-06 DIAGNOSIS — G4710 Hypersomnia, unspecified: Secondary | ICD-10-CM | POA: Diagnosis present

## 2023-11-06 DIAGNOSIS — E0590 Thyrotoxicosis, unspecified without thyrotoxic crisis or storm: Secondary | ICD-10-CM | POA: Diagnosis present

## 2023-11-06 DIAGNOSIS — R519 Headache, unspecified: Secondary | ICD-10-CM | POA: Diagnosis present

## 2023-11-06 DIAGNOSIS — I129 Hypertensive chronic kidney disease with stage 1 through stage 4 chronic kidney disease, or unspecified chronic kidney disease: Secondary | ICD-10-CM | POA: Diagnosis present

## 2023-11-06 DIAGNOSIS — E890 Postprocedural hypothyroidism: Secondary | ICD-10-CM | POA: Diagnosis present

## 2023-11-06 DIAGNOSIS — Z885 Allergy status to narcotic agent status: Secondary | ICD-10-CM

## 2023-11-06 DIAGNOSIS — Z888 Allergy status to other drugs, medicaments and biological substances status: Secondary | ICD-10-CM | POA: Diagnosis not present

## 2023-11-06 DIAGNOSIS — N189 Chronic kidney disease, unspecified: Secondary | ICD-10-CM | POA: Diagnosis present

## 2023-11-06 DIAGNOSIS — R7989 Other specified abnormal findings of blood chemistry: Secondary | ICD-10-CM | POA: Diagnosis present

## 2023-11-06 DIAGNOSIS — R55 Syncope and collapse: Secondary | ICD-10-CM | POA: Diagnosis present

## 2023-11-06 DIAGNOSIS — E876 Hypokalemia: Secondary | ICD-10-CM | POA: Diagnosis present

## 2023-11-06 DIAGNOSIS — G4730 Sleep apnea, unspecified: Secondary | ICD-10-CM | POA: Diagnosis present

## 2023-11-06 DIAGNOSIS — R030 Elevated blood-pressure reading, without diagnosis of hypertension: Secondary | ICD-10-CM

## 2023-11-06 DIAGNOSIS — E042 Nontoxic multinodular goiter: Secondary | ICD-10-CM | POA: Diagnosis present

## 2023-11-06 DIAGNOSIS — I161 Hypertensive emergency: Principal | ICD-10-CM | POA: Diagnosis present

## 2023-11-06 DIAGNOSIS — R296 Repeated falls: Secondary | ICD-10-CM | POA: Diagnosis present

## 2023-11-06 DIAGNOSIS — Z853 Personal history of malignant neoplasm of breast: Secondary | ICD-10-CM | POA: Diagnosis not present

## 2023-11-06 DIAGNOSIS — E538 Deficiency of other specified B group vitamins: Secondary | ICD-10-CM | POA: Diagnosis present

## 2023-11-06 DIAGNOSIS — Z9013 Acquired absence of bilateral breasts and nipples: Secondary | ICD-10-CM

## 2023-11-06 DIAGNOSIS — I10 Essential (primary) hypertension: Secondary | ICD-10-CM | POA: Diagnosis present

## 2023-11-06 DIAGNOSIS — M797 Fibromyalgia: Secondary | ICD-10-CM | POA: Diagnosis present

## 2023-11-06 DIAGNOSIS — Z7902 Long term (current) use of antithrombotics/antiplatelets: Secondary | ICD-10-CM | POA: Diagnosis not present

## 2023-11-06 DIAGNOSIS — Z79899 Other long term (current) drug therapy: Secondary | ICD-10-CM | POA: Diagnosis not present

## 2023-11-06 LAB — CBC WITH DIFFERENTIAL/PLATELET
Abs Immature Granulocytes: 0.02 10*3/uL (ref 0.00–0.07)
Basophils Absolute: 0 10*3/uL (ref 0.0–0.1)
Basophils Relative: 1 %
Eosinophils Absolute: 0.5 10*3/uL (ref 0.0–0.5)
Eosinophils Relative: 8 %
HCT: 42.3 % (ref 36.0–46.0)
Hemoglobin: 14.2 g/dL (ref 12.0–15.0)
Immature Granulocytes: 0 %
Lymphocytes Relative: 21 %
Lymphs Abs: 1.2 10*3/uL (ref 0.7–4.0)
MCH: 28.4 pg (ref 26.0–34.0)
MCHC: 33.6 g/dL (ref 30.0–36.0)
MCV: 84.6 fL (ref 80.0–100.0)
Monocytes Absolute: 0.5 10*3/uL (ref 0.1–1.0)
Monocytes Relative: 9 %
Neutro Abs: 3.5 10*3/uL (ref 1.7–7.7)
Neutrophils Relative %: 61 %
Platelets: 178 10*3/uL (ref 150–400)
RBC: 5 MIL/uL (ref 3.87–5.11)
RDW: 13.9 % (ref 11.5–15.5)
WBC: 5.7 10*3/uL (ref 4.0–10.5)
nRBC: 0 % (ref 0.0–0.2)

## 2023-11-06 LAB — URINALYSIS, W/ REFLEX TO CULTURE (INFECTION SUSPECTED)
Bilirubin Urine: NEGATIVE
Glucose, UA: NEGATIVE mg/dL
Ketones, ur: NEGATIVE mg/dL
Nitrite: NEGATIVE
Protein, ur: 30 mg/dL — AB
Specific Gravity, Urine: >=1.03 (ref 1.005–1.030)
pH: 5.5 (ref 5.0–8.0)

## 2023-11-06 LAB — COMPREHENSIVE METABOLIC PANEL
ALT: 18 U/L (ref 0–44)
AST: 21 U/L (ref 15–41)
Albumin: 3.9 g/dL (ref 3.5–5.0)
Alkaline Phosphatase: 77 U/L (ref 38–126)
Anion gap: 6 (ref 5–15)
BUN: 28 mg/dL — ABNORMAL HIGH (ref 8–23)
CO2: 28 mmol/L (ref 22–32)
Calcium: 9.5 mg/dL (ref 8.9–10.3)
Chloride: 109 mmol/L (ref 98–111)
Creatinine, Ser: 0.84 mg/dL (ref 0.44–1.00)
GFR, Estimated: >60 mL/min (ref >60)
Glucose, Bld: 97 mg/dL (ref 70–99)
Potassium: 3.4 mmol/L — ABNORMAL LOW (ref 3.5–5.1)
Sodium: 143 mmol/L (ref 135–145)
Total Bilirubin: 0.5 mg/dL (ref <1.2)
Total Protein: 6.8 g/dL (ref 6.5–8.1)

## 2023-11-06 LAB — MAGNESIUM: Magnesium: 1.7 mg/dL (ref 1.7–2.4)

## 2023-11-06 LAB — VITAMIN B12: Vitamin B-12: 1123 pg/mL — ABNORMAL HIGH (ref 180–914)

## 2023-11-06 LAB — TROPONIN I (HIGH SENSITIVITY)
Troponin I (High Sensitivity): 11 ng/L (ref <18)
Troponin I (High Sensitivity): 12 ng/L (ref <18)

## 2023-11-06 LAB — BRAIN NATRIURETIC PEPTIDE: B Natriuretic Peptide: 349.8 pg/mL — ABNORMAL HIGH (ref 0.0–100.0)

## 2023-11-06 MED ORDER — HYDRALAZINE HCL 20 MG/ML IJ SOLN
20.0000 mg | Freq: Once | INTRAMUSCULAR | Status: AC
  Administered 2023-11-06: 20 mg via INTRAVENOUS
  Filled 2023-11-06: qty 1

## 2023-11-06 MED ORDER — IBUPROFEN 600 MG PO TABS: 600.0000 mg | ORAL_TABLET | Freq: Four times a day (QID) | ORAL | Status: DC | PRN

## 2023-11-06 MED ORDER — SODIUM CHLORIDE 0.9% FLUSH: 3.0000 mL | INTRAVENOUS | Status: DC | PRN

## 2023-11-06 MED ORDER — CLONIDINE HCL 0.2 MG PO TABS
0.2000 mg | ORAL_TABLET | Freq: Two times a day (BID) | ORAL | Status: DC
  Administered 2023-11-06 – 2023-11-08 (×4): 0.2 mg via ORAL
  Filled 2023-11-06 (×4): qty 1

## 2023-11-06 MED ORDER — POLYETHYLENE GLYCOL 3350 17 G PO PACK
17.0000 g | PACK | Freq: Every day | ORAL | Status: DC
  Administered 2023-11-06 – 2023-11-07 (×2): 17 g via ORAL
  Filled 2023-11-06 (×3): qty 1

## 2023-11-06 MED ORDER — ROSUVASTATIN CALCIUM 20 MG PO TABS
20.0000 mg | ORAL_TABLET | Freq: Every day | ORAL | Status: DC
Start: 2023-11-06 — End: 2023-11-08
  Administered 2023-11-06 – 2023-11-07 (×2): 20 mg via ORAL
  Filled 2023-11-06 (×2): qty 1

## 2023-11-06 MED ORDER — PAROXETINE HCL 20 MG PO TABS
20.0000 mg | ORAL_TABLET | Freq: Every day | ORAL | Status: DC
Start: 2023-11-07 — End: 2023-11-08
  Administered 2023-11-07 – 2023-11-08 (×2): 20 mg via ORAL
  Filled 2023-11-06 (×2): qty 1

## 2023-11-06 MED ORDER — ONDANSETRON HCL 4 MG/2ML IJ SOLN: 4.0000 mg | Freq: Four times a day (QID) | INTRAMUSCULAR | Status: DC | PRN

## 2023-11-06 MED ORDER — HYDROMORPHONE HCL 1 MG/ML IJ SOLN
0.5000 mg | Freq: Once | INTRAMUSCULAR | Status: AC
  Administered 2023-11-06: 0.5 mg via INTRAVENOUS
  Filled 2023-11-06: qty 1

## 2023-11-06 MED ORDER — VITAMIN B-12 1000 MCG PO TABS
1000.0000 ug | ORAL_TABLET | Freq: Every day | ORAL | Status: DC
  Administered 2023-11-07 – 2023-11-08 (×2): 1000 ug via ORAL
  Filled 2023-11-06 (×2): qty 1

## 2023-11-06 MED ORDER — AMLODIPINE BESYLATE 2.5 MG PO TABS
2.5000 mg | ORAL_TABLET | Freq: Every day | ORAL | Status: DC
  Administered 2023-11-06 – 2023-11-07 (×2): 2.5 mg via ORAL
  Filled 2023-11-06 (×2): qty 1

## 2023-11-06 MED ORDER — MORPHINE SULFATE (PF) 2 MG/ML IV SOLN: 1.0000 mg | INTRAVENOUS | Status: DC | PRN

## 2023-11-06 MED ORDER — DIPHENHYDRAMINE HCL 50 MG/ML IJ SOLN
50.0000 mg | Freq: Once | INTRAMUSCULAR | Status: AC
  Administered 2023-11-06: 50 mg via INTRAVENOUS
  Filled 2023-11-06: qty 1

## 2023-11-06 MED ORDER — ONDANSETRON HCL 4 MG PO TABS: 4.0000 mg | ORAL_TABLET | Freq: Four times a day (QID) | ORAL | Status: DC | PRN

## 2023-11-06 MED ORDER — PANTOPRAZOLE SODIUM 40 MG PO TBEC
40.0000 mg | DELAYED_RELEASE_TABLET | Freq: Every day | ORAL | Status: DC
Start: 2023-11-06 — End: 2023-11-08
  Administered 2023-11-06 – 2023-11-07 (×2): 40 mg via ORAL
  Filled 2023-11-06 (×2): qty 1

## 2023-11-06 MED ORDER — ACETAMINOPHEN 325 MG PO TABS
650.0000 mg | ORAL_TABLET | Freq: Four times a day (QID) | ORAL | Status: DC | PRN
  Administered 2023-11-07 – 2023-11-08 (×4): 650 mg via ORAL
  Filled 2023-11-06 (×4): qty 2

## 2023-11-06 MED ORDER — PROCHLORPERAZINE EDISYLATE 10 MG/2ML IJ SOLN
10.0000 mg | Freq: Once | INTRAMUSCULAR | Status: AC
  Administered 2023-11-06: 10 mg via INTRAVENOUS
  Filled 2023-11-06: qty 2

## 2023-11-06 MED ORDER — CLOPIDOGREL BISULFATE 75 MG PO TABS
75.0000 mg | ORAL_TABLET | Freq: Every day | ORAL | Status: DC
  Administered 2023-11-07 – 2023-11-08 (×2): 75 mg via ORAL
  Filled 2023-11-06 (×2): qty 1

## 2023-11-06 MED ORDER — SODIUM CHLORIDE 0.9% FLUSH
3.0000 mL | Freq: Two times a day (BID) | INTRAVENOUS | Status: DC
  Administered 2023-11-06 – 2023-11-07 (×2): 3 mL via INTRAVENOUS

## 2023-11-06 MED ORDER — PREGABALIN 75 MG PO CAPS
300.0000 mg | ORAL_CAPSULE | Freq: Every day | ORAL | Status: DC
  Administered 2023-11-06 – 2023-11-07 (×2): 300 mg via ORAL
  Filled 2023-11-06 (×2): qty 4

## 2023-11-06 MED ORDER — METOPROLOL SUCCINATE ER 25 MG PO TB24
50.0000 mg | ORAL_TABLET | Freq: Every day | ORAL | Status: DC
  Administered 2023-11-06 – 2023-11-08 (×3): 50 mg via ORAL
  Filled 2023-11-06 (×3): qty 2

## 2023-11-06 MED ORDER — SODIUM CHLORIDE 0.9 % IV SOLN: 250.0000 mL | INTRAVENOUS | Status: AC | PRN

## 2023-11-06 MED ORDER — HYDRALAZINE HCL 20 MG/ML IJ SOLN
10.0000 mg | Freq: Once | INTRAMUSCULAR | Status: AC
  Administered 2023-11-06: 10 mg via INTRAVENOUS
  Filled 2023-11-06: qty 1

## 2023-11-06 MED ORDER — ACETAMINOPHEN 650 MG RE SUPP: 650.0000 mg | Freq: Four times a day (QID) | RECTAL | Status: DC | PRN

## 2023-11-06 MED ORDER — ENOXAPARIN SODIUM 40 MG/0.4ML IJ SOSY
40.0000 mg | PREFILLED_SYRINGE | INTRAMUSCULAR | Status: DC
  Administered 2023-11-07 – 2023-11-08 (×2): 40 mg via SUBCUTANEOUS
  Filled 2023-11-06 (×2): qty 0.4

## 2023-11-06 NOTE — ED Notes (Signed)
Called [facility] for transport 

## 2023-11-06 NOTE — ED Provider Notes (Signed)
[place] [facility] Provider Note   CSN: [id number] Arrival date & time: 11/06/23  1105     History  Chief Complaint  Patient presents with   Headache   Migraine    Cheryl Levy is a 72 y.o. female.  The history is provided by the patient, medical records and a relative. No language interpreter was used.  Headache Pain location:  Generalized Quality:  Dull Radiates to:  Does not radiate Severity currently:  9/10 Severity at highest:  10/10 Onset quality:  Gradual Duration:  3 days Timing:  Constant Progression:  Waxing and waning Chronicity:  New Similar to prior headaches: no   Context: bright light   Relieved by:  Nothing Worsened by:  Nothing Ineffective treatments:  None tried Associated symptoms: fatigue and syncope   Associated symptoms: no abdominal pain, no back pain, no blurred vision, no congestion, no cough, no dizziness, no focal weakness, no nausea, no neck pain, no neck stiffness, no paresthesias, no seizures and no vomiting   Migraine Associated symptoms include headaches. Pertinent negatives include no abdominal pain.       Home Medications Prior to Admission medications   Medication Sig Start Date End Date Taking? Authorizing Provider  acetaminophen (TYLENOL) 325 MG tablet Take 2 tablets (650 mg total) by mouth every 6 (six) hours as needed. 10/20/23   Sloan Leiter, DO  amLODipine (NORVASC) 2.5 MG tablet Take 1 tablet (2.5 mg total) by mouth daily. Patient not taking: Reported on 09/17/2023 12/30/22   Levin Erp, MD  beclomethasone (QVAR) 80 MCG/ACT inhaler Inhale 2 puffs into the lungs 2 (two) times daily. Patient not taking: Reported on 09/17/2023 05/23/14   Benjiman Core, MD  cloNIDine (CATAPRES) 0.2 MG tablet Take 0.2 mg by mouth 2 (two) times daily.    [provider]  clopidogrel (PLAVIX) 75 MG tablet Take 75 mg by mouth daily.    Antony Madura, MD  cyanocobalamin 1000 MCG tablet Take  1 tablet (1,000 mcg total) by mouth daily. 12/31/22   Levin Erp, MD  DULoxetine (CYMBALTA) 60 MG capsule Take 120 mg by mouth daily. 02/03/17   [provider]  Fluticasone-Salmeterol (ADVAIR) 500-50 MCG/DOSE AEPB Inhale 1 puff into the lungs 2 (two) times daily as needed (for breathing).    [provider]  ibuprofen (ADVIL) 600 MG tablet Take 1 tablet (600 mg total) by mouth every 6 (six) hours as needed. Take with food 10/20/23   Tanda Rockers A, DO  metoprolol succinate (TOPROL-XL) 50 MG 24 hr tablet Take 50 mg by mouth daily.    [provider]  omeprazole (PRILOSEC) 40 MG capsule Take 80 mg by mouth at bedtime.    [provider]  PARoxetine (PAXIL) 20 MG tablet Take 1 tablet (20 mg total) by mouth daily. 12/31/22   Levin Erp, MD  polyethylene glycol (MIRALAX / GLYCOLAX) 17 g packet Take 17 g by mouth daily. 04/02/23   Osvaldo Shipper, MD  pregabalin (LYRICA) 200 MG capsule Take 600 mg by mouth at bedtime.    [provider]  protriptyline (VIVACTIL) 10 MG tablet Take 10 mg by mouth at bedtime. Patient not taking: Reported on 09/17/2023 05/19/17   [provider]  rosuvastatin (CRESTOR) 20 MG tablet Take 1 tablet (20 mg total) by mouth daily. 12/31/22   Levin Erp, MD  senna-docusate (SENOKOT-S) 8.6-50 MG tablet Take 2 tablets by mouth 2 (two) times daily. Patient not taking: Reported on 09/17/2023 04/01/23  Osvaldo Shipper, MD      Allergies    Ace inhibitors and Oxycodone    Review of Systems   Review of Systems  Constitutional:  Positive for fatigue. Negative for chills and diaphoresis.  HENT:  Negative for congestion.   Eyes:  Negative for blurred vision and visual disturbance.  Respiratory:  Negative for cough and chest tightness.   Cardiovascular:  Positive for syncope.  Gastrointestinal:  Negative for abdominal pain, constipation, nausea and vomiting.  Genitourinary:  Negative for dysuria.  Musculoskeletal:   Negative for back pain, neck pain and neck stiffness.  Skin:  Negative for rash and wound.  Neurological:  Positive for syncope and headaches. Negative for dizziness, focal weakness, seizures and paresthesias.  Psychiatric/Behavioral:  Negative for agitation and confusion.   All other systems reviewed and are negative.   Physical Exam Updated Vital Signs BP (!) 165/81 (BP Location: Right Arm)   Pulse 62   Temp 97.9 F (36.6 C)   Resp 18   Ht 5\' 6"  (1.676 m)   Wt 71 kg   SpO2 100%   BMI 25.26 kg/m  Physical Exam Vitals and nursing note reviewed.  Constitutional:      General: She is not in acute distress.    Appearance: She is well-developed. She is not ill-appearing, toxic-appearing or diaphoretic.  HENT:     Head: Normocephalic and atraumatic.     Nose: No congestion or rhinorrhea.     Mouth/Throat:     Mouth: Mucous membranes are moist.     Pharynx: No oropharyngeal exudate or posterior oropharyngeal erythema.  Eyes:     General: No scleral icterus.    Extraocular Movements: Extraocular movements intact.     Conjunctiva/sclera: Conjunctivae normal.     Pupils: Pupils are equal, round, and reactive to light. Pupils are equal.  Cardiovascular:     Rate and Rhythm: Normal rate and regular rhythm.     Heart sounds: No murmur heard. Pulmonary:     Effort: Pulmonary effort is normal. No respiratory distress.     Breath sounds: Normal breath sounds. No wheezing, rhonchi or rales.  Chest:     Chest wall: No tenderness.  Abdominal:     General: Abdomen is flat.     Palpations: Abdomen is soft.     Tenderness: There is no abdominal tenderness.  Musculoskeletal:        General: No swelling or tenderness.     Cervical back: Neck supple.     Right lower leg: No edema.     Left lower leg: No edema.  Skin:    General: Skin is warm and dry.     Capillary Refill: Capillary refill takes less than 2 seconds.     Findings: No erythema or rash.  Neurological:     General: No  focal deficit present.     Mental Status: She is alert.     Sensory: No sensory deficit.     Motor: No weakness.  Psychiatric:        Mood and Affect: Mood normal.     ED Results / Procedures / Treatments   Labs (all labs ordered are listed, but only abnormal results are displayed) Labs Reviewed  COMPREHENSIVE METABOLIC PANEL - Abnormal; Notable for the following components:      Result Value   Potassium 3.4 (*)    BUN 28 (*)    All other components within normal limits  URINALYSIS, W/ REFLEX TO CULTURE (INFECTION SUSPECTED) - Abnormal; Notable  for the following components:   APPearance HAZY (*)    Hgb urine dipstick TRACE (*)    Protein, ur 30 (*)    Leukocytes,Ua TRACE (*)    Bacteria, UA MANY (*)    All other components within normal limits  CBC WITH DIFFERENTIAL/PLATELET  MAGNESIUM  BRAIN NATRIURETIC PEPTIDE  TSH  TROPONIN I (HIGH SENSITIVITY)  TROPONIN I (HIGH SENSITIVITY)    EKG EKG Interpretation Date/Time:  Saturday November 06 2023 14:00:28 EST Ventricular Rate:  59 PR Interval:  150 QRS Duration:  98 QT Interval:  408 QTC Calculation: 405 R Axis:   23  Text Interpretation: Sinus rhythm Probable anteroseptal infarct, old Borderline repolarization abnormality when compared to prior, similar appearance. No STEMI Confirmed by Theda Belfast ([id number]) on 11/06/2023 2:32:51 PM  Radiology DG Chest 2 View Result Date: 11/06/2023 CLINICAL DATA:  Syncope EXAM: CHEST - 2 VIEW COMPARISON:  08/13/2020 FINDINGS: The heart size and mediastinal contours are within normal limits. Aortic atherosclerosis. No focal airspace consolidation, pleural effusion, or pneumothorax. IMPRESSION: No active cardiopulmonary disease. Electronically Signed   By: Duanne Guess D.O.   On: 11/06/2023 15:35   CT Head Wo Contrast Result Date: 11/06/2023 CLINICAL DATA:  Head trauma. Recurrent syncope with trauma to head. Headache. Hypertension. EXAM: CT HEAD WITHOUT CONTRAST . TECHNIQUE:  Contiguous axial images were obtained from the base of the skull through the vertex without intravenous contrast. RADIATION DOSE REDUCTION: This exam was performed according to the departmental dose-optimization program which includes automated exposure control, adjustment of the mA and/or kV according to patient size and/or use of iterative reconstruction technique. COMPARISON:  CT head without contrast and MR head without contrast 03/30/2023 FINDINGS: Brain: Advanced diffuse subcortical and white matter hypoattenuation compatible with multiple remote infarcts and chronic small vessel disease is stable. Lacunar infarcts are again noted in the basal ganglia bilaterally. No acute infarct or significant interval change is present. The ventricles are of normal size. No significant extraaxial fluid collection is present. White matter changes extend into the brainstem. The brainstem and cerebellum are otherwise within normal limits. Midline structures are within normal limits. Vascular: Atherosclerotic calcifications are again noted within the cavernous internal carotid arteries and at the dural margin of the right vertebral artery. No hyperdense vessel is present. Skull: Calvarium is intact. No focal lytic or blastic lesions are present. No significant extracranial soft tissue lesion is present. Sinuses/Orbits: The paranasal sinuses and mastoid air cells are clear. The globes and orbits are within normal limits. IMPRESSION: 1. No acute intracranial abnormality or significant interval change. 2. Stable advanced diffuse subcortical and white matter hypoattenuation compatible with multiple remote infarcts and chronic small vessel disease. 3. Stable lacunar infarcts in the basal ganglia bilaterally. Electronically Signed   By: Marin Roberts M.D.   On: 11/06/2023 15:19    Procedures Procedures    CRITICAL CARE Performed by: Canary Brim Annaliese Saez Total critical care time: 25 minutes Critical care time was  exclusive of separately billable procedures and treating other patients. Critical care was necessary to treat or prevent imminent or life-threatening deterioration. Critical care was time spent personally by me on the following activities: development of treatment plan with patient and/or surrogate as well as nursing, discussions with consultants, evaluation of patient's response to treatment, examination of patient, obtaining history from patient or surrogate, ordering and performing treatments and interventions, ordering and review of laboratory studies, ordering and review of radiographic studies, pulse oximetry and re-evaluation of patient's condition.   Medications Ordered in  ED Medications  prochlorperazine (COMPAZINE) injection 10 mg (10 mg Intravenous Given 11/06/23 1406)  diphenhydrAMINE (BENADRYL) injection 50 mg (50 mg Intravenous Given 11/06/23 1402)  hydrALAZINE (APRESOLINE) injection 20 mg (20 mg Intravenous Given 11/06/23 1543)    ED Course/ Medical Decision Making/ A&P                                 Medical Decision Making Amount and/or Complexity of Data Reviewed Labs: ordered. Radiology: ordered.  Risk Prescription drug management. Decision regarding hospitalization.    Darlin Dressel is a 72 y.o. female with a past medical history significant for hypertension, asthma, previous stroke, and CKD who presents with multiple syncopal episodes, elevated blood pressure, and headache.  According to patient and family, 2 weeks ago patient had a syncopal episode where she fell and hurt her right shoulder.  She has had improvement in the shoulder pain but again several days ago had a fall that was really to a syncopal episode.  She reports she has no preceding symptoms including no chest pain, palpation, shortness of breath but then suddenly loses consciousness and goes to the ground.  She think she may have hit her head this time because she has been having headache over the last  several days feeling a vice around her head.  She is not someone who is had a long history of migraines but has had occasional headaches over the years.  She denies vision changes or speech troubles.  Denies any nausea, vomiting, constipation, diarrhea, or urinary changes at this time.  Denies any chest pain or palpitations.  Denies back or flank pain.  Denies neck pain or stiffness.  Reports shoulder has improved and discomfort of the last 2 weeks.  Patient reports that her last few days her blood pressures been over 200 although she does report that her husband has been in the hospital increasing the stress level.  She is on multiple indications for blood pressure and they do not seem to be working.  Patient is concerned by not only the headache after this fall and possible head injury but also the syncopal episodes that she has never had before.  On exam, lungs clear.  No murmur.  Chest nontender.  Abdomen nontender.  Good pulses in all extremities.  Intact sensation and strength in extremities.  Normal finger-nose-finger testing.  Symmetric smile.  Clear speech.  Pupils symmetric and reactive with normal extraocular movements.  No carotid bruit.  No neck tenderness or head tenderness on exam.  Right shoulder has slight tenderness but has good range of motion.  Exam otherwise unremarkable and patient is well-appearing.  I do feel need to do workup for multiple problems.  For the blood pressure over 200 and this severe headache over the last few days as well as with a recent fall and head injury, I feel she needs a CT of the head to look for bleeding or skull fracture.  The cause of the fall being multiple syncopal episodes is also concerned so we will do workup for this new syncope.  Will get labs, workup for occult infection, and reassess the patient.  Will give her headache cocktail as well as she reports her headache is severe still.  Anticipate reassessment after workup to determine if she needs  admission for the syncope or if there is any other concerning findings on her workup.     3:35 PM Unfortunately patient's headache is  still severe and now blood pressure has risen.  Blood pressure is now over 220 systolic with her headache.  I am still concerned about the multiple syncopal episodes over the last 2 weeks.  Her CT head was reassuring with no evidence of new stroke or bleeding.  Her labs began to return and CBC and CMP overall reassuring.  Magnesium normal.  Troponin normal.  Will trend.  TSH still in process.  Urinalysis appears similar to prior and she denies urinary symptoms now so we will send a culture but hold on antibiotics.  Her chest x-ray is still in process.  Still waiting on the results of the BNP, dull troponin, chest x-ray however will call for admission for hypertensive emergency causing headache that is uncontrolled despite headache cocktail and in the setting of 2 new syncopal episodes.  Will give hydralazine given her heart rate being in the 60s and blood pressure being over 220.   Anticipate she will need blood pressure control, echo, and further monitoring.  Care transferred to oncoming team to reassess and await admission.        Final Clinical Impression(s) / ED Diagnoses Final diagnoses:  Bad headache  Syncope and collapse  Elevated blood pressure reading  Hypertensive emergency    Clinical Impression: 1. Bad headache   2. Syncope and collapse   3. Elevated blood pressure reading   4. Hypertensive emergency     Disposition: Admit  This note was prepared with assistance of Dragon voice recognition software. Occasional wrong-word or sound-a-like substitutions may have occurred due to the inherent limitations of voice recognition software.     Nyna Chilton, Canary Brim, MD 11/06/23 [id number]

## 2023-11-06 NOTE — H&P (Signed)
History and Physical    Patient: Cheryl Levy MVH:[id number] DOB: 1951-02-05 DOA: 11/06/2023 DOS: the patient was seen and examined on 11/06/2023 PCP: Burnis Medin, PA-C  Patient coming from: Home  Chief Complaint:  Chief Complaint  Patient presents with   Headache   Migraine   HPI: Cheryl Levy is a 72 y.o. female with medical history significant of hypertension, ER positive breast cancer, CVA, depression with anxiety, fibromyalgia, asthma, chronic kidney disease who presents to the [facility] with complaint of terrible headache.  Patient notes she has had 2 recent falls with syncope where she was unsure how she got on the floor.  One was several weeks ago where she hurt her shoulder.  One was in the last few days and she has had a headache ever since.  She does report elevated blood pressures at home in the 200 range.  She does report taking 2 blood pressure medications daily including Clonidine and Toprol.  She states she has continued to take these but her stress has been elevated recently as her husband has been hospitalized and just came home on last week. In the ED patient was noted to have a negative head CT and negative chest x-ray.  She had normal troponins, LFTs, BMP and CBC.  She had worsening elevation of her blood pressures as high as a diastolic in the 220 range and was given IV hydralazine.  The patient did note elevated BNP at 349.8.  The patient denies significant shortness of breath.  Review of Systems: As mentioned in the history of present illness. All other systems reviewed and are negative. Past Medical History:  Diagnosis Date   Cancer [facility])    breast cancer   Hypertension    Stroke (cerebrum) (HCC)    Thyroid disease    Past Surgical History:  Procedure Laterality Date   ABDOMINAL HYSTERECTOMY     BACK SURGERY     JOINT REPLACEMENT     MASTECTOMY Bilateral    THYROIDECTOMY     Social History:  reports that she has never smoked. She has  never used smokeless tobacco. She reports that she does not drink alcohol and does not use drugs.  Allergies  Allergen Reactions   Ace Inhibitors Other (See Comments)    Chest pain    Oxycodone     History reviewed. No pertinent family history.  Prior to Admission medications   Medication Sig Start Date End Date Taking? Authorizing Provider  acetaminophen (TYLENOL) 325 MG tablet Take 2 tablets (650 mg total) by mouth every 6 (six) hours as needed. 10/20/23   Sloan Leiter, DO  amLODipine (NORVASC) 2.5 MG tablet Take 1 tablet (2.5 mg total) by mouth daily. Patient not taking: Reported on 09/17/2023 12/30/22   Levin Erp, MD  beclomethasone (QVAR) 80 MCG/ACT inhaler Inhale 2 puffs into the lungs 2 (two) times daily. Patient not taking: Reported on 09/17/2023 05/23/14   Benjiman Core, MD  cloNIDine (CATAPRES) 0.2 MG tablet Take 0.2 mg by mouth 2 (two) times daily.    [provider]  clopidogrel (PLAVIX) 75 MG tablet Take 75 mg by mouth daily.    Antony Madura, MD  cyanocobalamin 1000 MCG tablet Take 1 tablet (1,000 mcg total) by mouth daily. 12/31/22   Levin Erp, MD  DULoxetine (CYMBALTA) 60 MG capsule Take 120 mg by mouth daily. 02/03/17   [provider]  Fluticasone-Salmeterol (ADVAIR) 500-50 MCG/DOSE AEPB Inhale 1 puff into the lungs 2 (two) times daily as needed (  for breathing).    [provider]  ibuprofen (ADVIL) 600 MG tablet Take 1 tablet (600 mg total) by mouth every 6 (six) hours as needed. Take with food 10/20/23   Tanda Rockers A, DO  metoprolol succinate (TOPROL-XL) 50 MG 24 hr tablet Take 50 mg by mouth daily.    [provider]  omeprazole (PRILOSEC) 40 MG capsule Take 80 mg by mouth at bedtime.    [provider]  PARoxetine (PAXIL) 20 MG tablet Take 1 tablet (20 mg total) by mouth daily. 12/31/22   Levin Erp, MD  polyethylene glycol (MIRALAX / GLYCOLAX) 17 g packet Take 17 g by mouth daily. 04/02/23   Osvaldo Shipper, MD  pregabalin (LYRICA) 200 MG capsule Take 600 mg by mouth at bedtime.    [provider]  rosuvastatin (CRESTOR) 20 MG tablet Take 1 tablet (20 mg total) by mouth daily. 12/31/22   Levin Erp, MD  senna-docusate (SENOKOT-S) 8.6-50 MG tablet Take 2 tablets by mouth 2 (two) times daily. Patient not taking: Reported on 09/17/2023 04/01/23   Osvaldo Shipper, MD    Physical Exam: Vitals:   11/06/23 1730 11/06/23 1745 11/06/23 1800 11/06/23 1931  BP: (!) 196/71 (!) 188/70 (!) 173/103 (!) 175/81  Pulse: 83 84 76 82  Resp: (!) 21 17 20 16   Temp:   97.9 F (36.6 C) 98.2 F (36.8 C)  TempSrc:    Oral  SpO2: 98% 95% 99% 100%  Weight:      Height:       Physical Examination: General appearance - alert, well appearing, and in no distress Neck - supple, no significant adenopathy Chest - clear to auscultation, no wheezes, rales or rhonchi, symmetric air entry Heart - normal rate, regular rhythm, normal S1, S2, no murmurs, rubs, clicks or gallops Abdomen - soft, nontender, nondistended, no masses or organomegaly Neurological - alert, oriented, normal speech, no focal findings or movement disorder noted Extremities - peripheral pulses normal, no pedal edema, no clubbing or cyanosis Skin - normal coloration and turgor, no rashes, no suspicious skin lesions noted  Data Reviewed: Results for orders placed or performed during the hospital encounter of 11/06/23 (from the past 24 hours)  CBC with Differential     Status: None   Collection Time: 11/06/23  1:57 PM  Result Value Ref Range   WBC 5.7 4.0 - 10.5 K/uL   RBC 5.00 3.87 - 5.11 MIL/uL   Hemoglobin 14.2 12.0 - 15.0 g/dL   HCT 10.2 72.5 - 36.6 %   MCV 84.6 80.0 - 100.0 fL   MCH 28.4 26.0 - 34.0 pg   MCHC 33.6 30.0 - 36.0 g/dL   RDW 44.0 34.7 - 42.5 %   Platelets 178 150 - 400 K/uL   nRBC 0.0 0.0 - 0.2 %   Neutrophils Relative % 61 %   Neutro Abs 3.5 1.7 - 7.7 K/uL   Lymphocytes Relative 21 %   Lymphs Abs 1.2 0.7 -  4.0 K/uL   Monocytes Relative 9 %   Monocytes Absolute 0.5 0.1 - 1.0 K/uL   Eosinophils Relative 8 %   Eosinophils Absolute 0.5 0.0 - 0.5 K/uL   Basophils Relative 1 %   Basophils Absolute 0.0 0.0 - 0.1 K/uL   Immature Granulocytes 0 %   Abs Immature Granulocytes 0.02 0.00 - 0.07 K/uL  Comprehensive metabolic panel     Status: Abnormal   Collection Time: 11/06/23  1:57 PM  Result Value Ref Range   Sodium  143 135 - 145 mmol/L   Potassium 3.4 (L) 3.5 - 5.1 mmol/L   Chloride 109 98 - 111 mmol/L   CO2 28 22 - 32 mmol/L   Glucose, Bld 97 70 - 99 mg/dL   BUN 28 (H) 8 - 23 mg/dL   Creatinine, Ser 3.08 0.44 - 1.00 mg/dL   Calcium 9.5 8.9 - 65.7 mg/dL   Total Protein 6.8 6.5 - 8.1 g/dL   Albumin 3.9 3.5 - 5.0 g/dL   AST 21 15 - 41 U/L   ALT 18 0 - 44 U/L   Alkaline Phosphatase 77 38 - 126 U/L   Total Bilirubin 0.5 <1.2 mg/dL   GFR, Estimated >84 >69 mL/min   Anion gap 6 5 - 15  Troponin I (High Sensitivity)     Status: None   Collection Time: 11/06/23  1:57 PM  Result Value Ref Range   Troponin I (High Sensitivity) 11 <18 ng/L  Brain natriuretic peptide     Status: Abnormal   Collection Time: 11/06/23  1:57 PM  Result Value Ref Range   B Natriuretic Peptide 349.8 (H) 0.0 - 100.0 pg/mL  Magnesium     Status: None   Collection Time: 11/06/23  1:57 PM  Result Value Ref Range   Magnesium 1.7 1.7 - 2.4 mg/dL  Urinalysis, w/ Reflex to Culture (Infection Suspected) -Urine, Clean Catch     Status: Abnormal   Collection Time: 11/06/23  1:57 PM  Result Value Ref Range   Specimen Source URINE, CLEAN CATCH    Color, Urine YELLOW YELLOW   APPearance HAZY (A) CLEAR   Specific Gravity, Urine >=1.030 1.005 - 1.030   pH 5.5 5.0 - 8.0   Glucose, UA NEGATIVE NEGATIVE mg/dL   Hgb urine dipstick TRACE (A) NEGATIVE   Bilirubin Urine NEGATIVE NEGATIVE   Ketones, ur NEGATIVE NEGATIVE mg/dL   Protein, ur 30 (A) NEGATIVE mg/dL   Nitrite NEGATIVE NEGATIVE   Leukocytes,Ua TRACE (A) NEGATIVE    Squamous Epithelial / HPF 6-10 0 - 5 /HPF   WBC, UA 0-5 0 - 5 WBC/hpf   RBC / HPF 0-5 0 - 5 RBC/hpf   Bacteria, UA MANY (A) NONE SEEN  Troponin I (High Sensitivity)     Status: None   Collection Time: 11/06/23  3:54 PM  Result Value Ref Range   Troponin I (High Sensitivity) 12 <18 ng/L   DG Chest 2 View Result Date: 11/06/2023 CLINICAL DATA:  Syncope EXAM: CHEST - 2 VIEW COMPARISON:  08/13/2020 FINDINGS: The heart size and mediastinal contours are within normal limits. Aortic atherosclerosis. No focal airspace consolidation, pleural effusion, or pneumothorax. IMPRESSION: No active cardiopulmonary disease. Electronically Signed   By: Duanne Guess D.O.   On: 11/06/2023 15:35   CT Head Wo Contrast Result Date: 11/06/2023 CLINICAL DATA:  Head trauma. Recurrent syncope with trauma to head. Headache. Hypertension. EXAM: CT HEAD WITHOUT CONTRAST . TECHNIQUE: Contiguous axial images were obtained from the base of the skull through the vertex without intravenous contrast. RADIATION DOSE REDUCTION: This exam was performed according to the departmental dose-optimization program which includes automated exposure control, adjustment of the mA and/or kV according to patient size and/or use of iterative reconstruction technique. COMPARISON:  CT head without contrast and MR head without contrast 03/30/2023 FINDINGS: Brain: Advanced diffuse subcortical and white matter hypoattenuation compatible with multiple remote infarcts and chronic small vessel disease is stable. Lacunar infarcts are again noted in the basal ganglia bilaterally. No acute infarct or significant interval  change is present. The ventricles are of normal size. No significant extraaxial fluid collection is present. White matter changes extend into the brainstem. The brainstem and cerebellum are otherwise within normal limits. Midline structures are within normal limits. Vascular: Atherosclerotic calcifications are again noted within the cavernous  internal carotid arteries and at the dural margin of the right vertebral artery. No hyperdense vessel is present. Skull: Calvarium is intact. No focal lytic or blastic lesions are present. No significant extracranial soft tissue lesion is present. Sinuses/Orbits: The paranasal sinuses and mastoid air cells are clear. The globes and orbits are within normal limits. IMPRESSION: 1. No acute intracranial abnormality or significant interval change. 2. Stable advanced diffuse subcortical and white matter hypoattenuation compatible with multiple remote infarcts and chronic small vessel disease. 3. Stable lacunar infarcts in the basal ganglia bilaterally. Electronically Signed   By: Marin Roberts M.D.   On: 11/06/2023 15:19   DG Shoulder Right Result Date: 10/20/2023 CLINICAL DATA:  Fall out of bed 2 nights ago with right shoulder pain, initial encounter EXAM: RIGHT SHOULDER - 2+ VIEW COMPARISON:  None Available. FINDINGS: Mild degenerative changes of the acromioclavicular and glenohumeral joints are seen. No acute fracture or dislocation is noted. No soft tissue abnormality is seen. IMPRESSION: No acute abnormality noted.  Mild degenerative changes are noted. Electronically Signed   By: Alcide Clever M.D.   On: 10/20/2023 20:52     Assessment and Plan: Hypertensive emergency With headache.  Her home med list includes amlodipine but she is unclear if she has been taking that we will add it back.  The dose is 2.5 and certainly has room to go up.  Continue her Catapres 0.2 mg twice daily and Toprol XL 50 mg.  She appears appropriately beta blocked so would increase her Catapres and amlodipine as first-line. IV hydralazine needed  History of ischemic stroke No acute findings on CT The patient has significant chronic small vessel disease which may be impacting her balance. On Plavix Continue Crestor  HTN (hypertension) Continue amlodipine Continue Catapres Continue Toprol  Hypersomnia with sleep  apnea Per patient has history of narcolepsy.  On no meds currently  Syncope and collapse Unclear etiology Check orthostatics PT to eval and treat Check echo Telemetry Consider polypharmacy.  Patient states she has been on this current combination of medications but given her recent falls had decreased her Lyrica to 400 mg nightly.  The patient on admission is on Cymbalta at 120 mg, Paxil and high dose of Lyrica (600 mg nightly). I have halved her Lyrica dose and held her Cymbalta to see if this improves anything.  Bad headache Likely related to her blood pressure.  She did receive headache cocktail at the [facility] ED which did not help. Tylenol, ibuprofen, morphine as needed  Gastroesophageal reflux disease Continue PPI    Goiter, nontoxic, multinodular TSH is pending  Hyperthyroidism Currently on no meds, TSH is pending    Major depressive disorder, recurrent, in full remission (HCC) Continue Paxil Holding her Cymbalta for now, these were started after her stroke which occurred in February of this year    Vitamin B 12 deficiency Continue repletion Check level   Advance Care Planning:   Code Status: Full Code confirmed with patient at bedside  Consults: None for now  Family Communication: Patient at bedside  Severity of Illness: The appropriate patient status for this patient is INPATIENT. Inpatient status is judged to be reasonable and necessary in order to provide the required  intensity of service to ensure the patient's safety. The patient's presenting symptoms, physical exam findings, and initial radiographic and laboratory data in the context of their chronic comorbidities is felt to place them at high risk for further clinical deterioration. Furthermore, it is not anticipated that the patient will be medically stable for discharge from the hospital within 2 midnights of admission.   * I certify that at the point of admission it is my clinical judgment that  the patient will require inpatient hospital care spanning beyond 2 midnights from the point of admission due to high intensity of service, high risk for further deterioration and high frequency of surveillance required.*  Author: Reva Bores, MD 11/06/2023 9:04 PM  For on call review [email or web address].

## 2023-11-06 NOTE — ED Provider Notes (Signed)
Patient will be admitted for 2 unexplained syncopal episodes cardiac monitoring and also for the hypertensive urgency emergency with the high blood pressure.   Cheryl Mulders, MD 11/06/23 [phone]

## 2023-11-06 NOTE — ED Triage Notes (Signed)
The patient having headache and high blood pressure. Hx of stroke. No head injury.

## 2023-11-07 ENCOUNTER — Inpatient Hospital Stay (HOSPITAL_COMMUNITY): Payer: Medicare HMO

## 2023-11-07 DIAGNOSIS — R55 Syncope and collapse: Secondary | ICD-10-CM

## 2023-11-07 DIAGNOSIS — I161 Hypertensive emergency: Secondary | ICD-10-CM | POA: Diagnosis not present

## 2023-11-07 LAB — T4, FREE: Free T4: 1.08 ng/dL (ref 0.61–1.12)

## 2023-11-07 LAB — CBC
HCT: 39.5 % (ref 36.0–46.0)
Hemoglobin: 13.4 g/dL (ref 12.0–15.0)
MCH: 28.4 pg (ref 26.0–34.0)
MCHC: 33.9 g/dL (ref 30.0–36.0)
MCV: 83.7 fL (ref 80.0–100.0)
Platelets: 168 10*3/uL (ref 150–400)
RBC: 4.72 MIL/uL (ref 3.87–5.11)
RDW: 14 % (ref 11.5–15.5)
WBC: 6.2 10*3/uL (ref 4.0–10.5)
nRBC: 0 % (ref 0.0–0.2)

## 2023-11-07 LAB — COMPREHENSIVE METABOLIC PANEL
ALT: 14 U/L (ref 0–44)
AST: 21 U/L (ref 15–41)
Albumin: 3.2 g/dL — ABNORMAL LOW (ref 3.5–5.0)
Alkaline Phosphatase: 65 U/L (ref 38–126)
Anion gap: 9 (ref 5–15)
BUN: 21 mg/dL (ref 8–23)
CO2: 26 mmol/L (ref 22–32)
Calcium: 9.1 mg/dL (ref 8.9–10.3)
Chloride: 107 mmol/L (ref 98–111)
Creatinine, Ser: 0.86 mg/dL (ref 0.44–1.00)
GFR, Estimated: >60 mL/min (ref >60)
Glucose, Bld: 100 mg/dL — ABNORMAL HIGH (ref 70–99)
Potassium: 3.1 mmol/L — ABNORMAL LOW (ref 3.5–5.1)
Sodium: 142 mmol/L (ref 135–145)
Total Bilirubin: 0.8 mg/dL (ref <1.2)
Total Protein: 6 g/dL — ABNORMAL LOW (ref 6.5–8.1)

## 2023-11-07 LAB — ECHOCARDIOGRAM COMPLETE
Area-P 1/2: 2.62 cm2
Height: 66 in
P 1/2 time: 628 ms
Weight: 2451.52 [oz_av]

## 2023-11-07 LAB — TSH: TSH: 0.168 u[IU]/mL — ABNORMAL LOW (ref 0.350–4.500)

## 2023-11-07 MED ORDER — AMLODIPINE BESYLATE 10 MG PO TABS
10.0000 mg | ORAL_TABLET | Freq: Every day | ORAL | Status: DC
  Administered 2023-11-08: 10 mg via ORAL
  Filled 2023-11-07: qty 1

## 2023-11-07 MED ORDER — MAGNESIUM SULFATE 2 GM/50ML IV SOLN
2.0000 g | Freq: Once | INTRAVENOUS | Status: AC
  Administered 2023-11-07: 2 g via INTRAVENOUS
  Filled 2023-11-07: qty 50

## 2023-11-07 MED ORDER — POTASSIUM CHLORIDE CRYS ER 20 MEQ PO TBCR
60.0000 meq | EXTENDED_RELEASE_TABLET | Freq: Once | ORAL | Status: AC
  Administered 2023-11-07: 60 meq via ORAL
  Filled 2023-11-07: qty 3

## 2023-11-07 MED ORDER — AMLODIPINE BESYLATE 5 MG PO TABS
5.0000 mg | ORAL_TABLET | Freq: Once | ORAL | Status: AC
  Administered 2023-11-07: 5 mg via ORAL
  Filled 2023-11-07: qty 1

## 2023-11-07 NOTE — Evaluation (Signed)
Physical Therapy Evaluation Patient Details Name: Cheryl Levy MRN: [id number] DOB: 1951-06-03 Today's Date: 11/07/2023  History of Present Illness  72 y.o. female presents to [facility] 11/06/23 from [facility] w/ h/a, high BP, and 2 prior falls w/ unknown mechanism and syncope. Negative head CT ad chest x-ray. Prior admit 5/21 with AKI. PMH: hypertension, depression, anxiety, fibromyalgia, history of CVA, and narcolepsy   Clinical Impression  Pt in bed upon arrival and agreeable to PT eval. Prior to admit, pt was independent w/ no AD. Pt reported two falls where she wakes up on the ground and does not know what precipitated the falls. Pt presents to therapy session with decreased balance during dynamic activities. Pt scored 18/24 on DGI indicating a high falls risk. Pt was ModI will all mobility at this time. Pt would benefit from acute skilled PT to address functional impairments. Recommending post-acute OP PT to work on improving balance. Acute PT to follow.       11/07/23 0900  Orthostatic Lying   BP- Lying 141/84  Pulse- Lying 62  Orthostatic Sitting  BP- Sitting (!) 115/96  Pulse- Sitting 60  Orthostatic Standing at 0 minutes  BP- Standing at 0 minutes 150/76  Pulse- Standing at 0 minutes 61  Orthostatic Standing at 3 minutes  BP- Standing at 3 minutes (!) 128/109        If plan is discharge home, recommend the following: Help with stairs or ramp for entrance;A little help with walking and/or transfers   Can travel by private vehicle    Yes    Equipment Recommendations None recommended by PT     Functional Status Assessment Patient has had a recent decline in their functional status and demonstrates the ability to make significant improvements in function in a reasonable and predictable amount of time.     Precautions / Restrictions Precautions Precautions: Fall Restrictions Weight Bearing Restrictions Per Provider Order: No      Mobility  Bed Mobility Overal bed  mobility: Modified Independent    General bed mobility comments: HOB elevated    Transfers Overall transfer level: Modified independent Equipment used: None   Ambulation/Gait Ambulation/Gait assistance: Modified independent (Device/Increase time) Gait Distance (Feet): 200 Feet Assistive device: None Gait Pattern/deviations: Step-through pattern, Narrow base of support, Shuffle Gait velocity: dec     General Gait Details: decreased gait speed, able to accept challenges w/ no LOB.       Balance Overall balance assessment: Needs assistance, History of Falls Sitting-balance support: No upper extremity supported, Feet supported Sitting balance-Leahy Scale: Good     Standing balance support: No upper extremity supported Standing balance-Leahy Scale: Good      High level balance activites: Direction changes, Turns, Head turns High Level Balance Comments: unable to change gait speed, slower gait with head turns and direction changes Standardized Balance Assessment Standardized Balance Assessment : Dynamic Gait Index   Dynamic Gait Index Level Surface: Mild Impairment Change in Gait Speed: Moderate Impairment Gait with Horizontal Head Turns: Mild Impairment Gait with Vertical Head Turns: Mild Impairment Gait and Pivot Turn: Normal Step Over Obstacle: Normal Step Around Obstacles: Normal Steps: Mild Impairment Total Score: 18       Pertinent Vitals/Pain Pain Assessment Pain Assessment: Faces Faces Pain Scale: Hurts a little bit Pain Location: h/a Pain Descriptors / Indicators: Aching, Discomfort Pain Intervention(s): Limited activity within patient's tolerance, Monitored during session, Premedicated before session, Repositioned    Home Living Family/patient expects to be discharged to:: Private residence Living Arrangements: Children;Spouse/significant  other Available Help at Discharge: Family Type of Home: House Home Access: Stairs to enter;Ramped entrance    Entrance Stairs-Number of Steps: 1   Home Layout: Two level;Able to live on main level with bedroom/bathroom Home Equipment: Grab bars - tub/shower;Shower seat;BSC/3in1;Rolling Environmental consultant (2 wheels);Wheelchair - manual      Prior Function Prior Level of Function : Driving;Independent/Modified Independent      Mobility Comments: Mild imbalance at baseline but does not use AD ADLs Comments: takes care of husband 24/7, B amputee     Extremity/Trunk Assessment   Upper Extremity Assessment Upper Extremity Assessment: Overall WFL for tasks assessed    Lower Extremity Assessment Lower Extremity Assessment: Overall WFL for tasks assessed    Cervical / Trunk Assessment Cervical / Trunk Assessment: Normal  Communication   Communication Communication: No apparent difficulties  Cognition Arousal: Alert Behavior During Therapy: WFL for tasks assessed/performed Overall Cognitive Status: Within Functional Limits for tasks assessed     General Comments General comments (skin integrity, edema, etc.): See BP readings, no symptoms of dizziness     PT Assessment Patient needs continued PT services  PT Problem List Decreased activity tolerance;Decreased balance;Decreased mobility       PT Treatment Interventions DME instruction;Gait training;Functional mobility training;Therapeutic activities;Therapeutic exercise;Balance training;Neuromuscular re-education;Patient/family education    PT Goals (Current goals can be found in the Care Plan section)  Acute Rehab PT Goals Patient Stated Goal: to improve balance PT Goal Formulation: With patient Time For Goal Achievement: 11/21/23 Potential to Achieve Goals: Good    Frequency Min 1X/week        AM-PAC PT "6 Clicks" Mobility  Outcome Measure Help needed turning from your back to your side while in a flat bed without using bedrails?: None Help needed moving from lying on your back to sitting on the side of a flat bed without using  bedrails?: None Help needed moving to and from a bed to a chair (including a wheelchair)?: None Help needed standing up from a chair using your arms (e.g., wheelchair or bedside chair)?: None Help needed to walk in hospital room?: None Help needed climbing 3-5 steps with a railing? : A Little 6 Click Score: 23    End of Session Equipment Utilized During Treatment: Gait belt Activity Tolerance: Patient tolerated treatment well Patient left: in bed;with call bell/phone within reach Nurse Communication: Mobility status PT Visit Diagnosis: Other abnormalities of gait and mobility (R26.89);Repeated falls (R29.6)    Time: 4166-0630 PT Time Calculation (min) (ACUTE ONLY): 24 min   Charges:   PT Evaluation $PT Eval Low Complexity: 1 Low PT Treatments $Neuromuscular Re-education: 8-22 mins PT General Charges $$ ACUTE PT VISIT: 1 Visit         Hilton Cork, PT, DPT Secure Chat Preferred  Rehab Office [phone]   Arturo Morton Brion Aliment 11/07/2023, 9:48 AM

## 2023-11-07 NOTE — Progress Notes (Signed)
°  Echocardiogram 2D Echocardiogram has been performed.  Cheryl Levy 11/07/2023, 2:10 PM

## 2023-11-07 NOTE — Progress Notes (Signed)
PROGRESS NOTE  Cheryl Levy  UJW:[id number] DOB: 1951/08/12 DOA: 11/06/2023 PCP: Burnis Medin, PA-C   Brief Narrative: Patient is a 72 year old female with history of hypertension, breast cancer, depression, fibromyalgia, CKD who presented with complaint of headache, 2 episodes of fall with syncopal.  On presentation she was severely hypertensive.  CT head was negative for any acute findings.  Patient was admitted for further evaluation of syncopal episode.  Orthostatic vitals checked this morning is negative.  Waiting for echocardiogram.  Currently remains hemodynamically stable.  Assessment & Plan:  Principal Problem:   Hypertensive emergency Active Problems:   History of ischemic stroke   HTN (hypertension)   Hypersomnia with sleep apnea   Syncope and collapse   Bad headache   Gastroesophageal reflux disease   Goiter, nontoxic, multinodular   Hyperthyroidism   Major depressive disorder, recurrent, in full remission (HCC)   Vitamin B 12 deficiency   Hypertensive urgency: Presented with severe headache, severe hypertension.  Blood pressure much better now.  Continue Toprol, amlodipine, clonidine.  Will increase the dose of amlodipine to 10 mg daily  Syncopal episode/fall: Lives with her husband.  Ambulates fine at home.  Report of falling at home.  No report of loss of consciousness or head injury.  CT head did not show any acute findings.  Orthostatic vital check this morning is negative.  PT recommend outpatient follow-up. Polypharmacy could be a possibility.  On Cymbalta, Paxil, Lyrica 600 mg.  Lyrica decreased to 400 mg Echo pending.  EKG did not show any heart blocks or ischemic findings  Headache: Resolved.  Likely due to severe hypertension  GERD: Continue PPI  History of nodular goiter/ hyperthyroidism: TSH is low,.  Will check free T3 and free T4.  She needs to follow-up with endocrinology as an outpatient.  History of depression: On Paxil, Cymbalta at  home.  Severe hypokalemia/hypomagnesemia: Currently supplemented.  Check levels tomorrow           DVT prophylaxis:enoxaparin (LOVENOX) injection 40 mg Start: 11/07/23 1000     Code Status: Full Code  Family Communication: None at bedside  Patient status:Inpatient  Patient is from :home  Anticipated discharge FA:OZHY  Estimated DC date:tomorrow   Consultants: None  Procedures:None  Antimicrobials:  Anti-infectives (From admission, onward)    None       Subjective: Patient seen and examined at bedside today.  She looks very comfortable.  Sitting on the bed.  Denies any headache today.  No nausea or vomiting.  Mildly hypertensive this morning but blood pressure has improved since admission.  Echo pending  Objective: Vitals:   11/07/23 0406 11/07/23 0407 11/07/23 0415 11/07/23 0853  BP: (!) 149/58 (!) 146/56  (!) 150/91  Pulse: (!) 59 62  61  Resp:    16  Temp:    97.7 F (36.5 C)  TempSrc:    Oral  SpO2: 95% 100%    Weight:   69.5 kg   Height:        Intake/Output Summary (Last 24 hours) at 11/07/2023 1432 Last data filed at 11/07/2023 1106 Gross per 24 hour  Intake 50 ml  Output --  Net 50 ml   Filed Weights   11/06/23 1110 11/07/23 0415  Weight: 71 kg 69.5 kg    Examination:  General exam: Overall comfortable, not in distress HEENT: PERRL Respiratory system:  no wheezes or crackles  Cardiovascular system: S1 & S2 heard, RRR.  Gastrointestinal system: Abdomen is nondistended, soft and nontender. Central nervous  system: Alert and oriented Extremities: No edema, no clubbing ,no cyanosis Skin: No rashes, no ulcers,no icterus     Data Reviewed: I have personally reviewed following labs and imaging studies  CBC: Recent Labs  Lab 11/06/23 1357 11/07/23 0521  WBC 5.7 6.2  NEUTROABS 3.5  --   HGB 14.2 13.4  HCT 42.3 39.5  MCV 84.6 83.7  PLT 178 168   Basic Metabolic Panel: Recent Labs  Lab 11/06/23 1357 11/07/23 0521  NA 143  142  K 3.4* 3.1*  CL 109 107  CO2 28 26  GLUCOSE 97 100*  BUN 28* 21  CREATININE 0.84 0.86  CALCIUM 9.5 9.1  MG 1.7  --      No results found for this or any previous visit (from the past 240 hours).   Radiology Studies: DG Chest 2 View Result Date: 11/06/2023 CLINICAL DATA:  Syncope EXAM: CHEST - 2 VIEW COMPARISON:  08/13/2020 FINDINGS: The heart size and mediastinal contours are within normal limits. Aortic atherosclerosis. No focal airspace consolidation, pleural effusion, or pneumothorax. IMPRESSION: No active cardiopulmonary disease. Electronically Signed   By: Duanne Guess D.O.   On: 11/06/2023 15:35   CT Head Wo Contrast Result Date: 11/06/2023 CLINICAL DATA:  Head trauma. Recurrent syncope with trauma to head. Headache. Hypertension. EXAM: CT HEAD WITHOUT CONTRAST . TECHNIQUE: Contiguous axial images were obtained from the base of the skull through the vertex without intravenous contrast. RADIATION DOSE REDUCTION: This exam was performed according to the departmental dose-optimization program which includes automated exposure control, adjustment of the mA and/or kV according to patient size and/or use of iterative reconstruction technique. COMPARISON:  CT head without contrast and MR head without contrast 03/30/2023 FINDINGS: Brain: Advanced diffuse subcortical and white matter hypoattenuation compatible with multiple remote infarcts and chronic small vessel disease is stable. Lacunar infarcts are again noted in the basal ganglia bilaterally. No acute infarct or significant interval change is present. The ventricles are of normal size. No significant extraaxial fluid collection is present. White matter changes extend into the brainstem. The brainstem and cerebellum are otherwise within normal limits. Midline structures are within normal limits. Vascular: Atherosclerotic calcifications are again noted within the cavernous internal carotid arteries and at the dural margin of the right  vertebral artery. No hyperdense vessel is present. Skull: Calvarium is intact. No focal lytic or blastic lesions are present. No significant extracranial soft tissue lesion is present. Sinuses/Orbits: The paranasal sinuses and mastoid air cells are clear. The globes and orbits are within normal limits. IMPRESSION: 1. No acute intracranial abnormality or significant interval change. 2. Stable advanced diffuse subcortical and white matter hypoattenuation compatible with multiple remote infarcts and chronic small vessel disease. 3. Stable lacunar infarcts in the basal ganglia bilaterally. Electronically Signed   By: Marin Roberts M.D.   On: 11/06/2023 15:19    Scheduled Meds:  amLODipine  2.5 mg Oral Daily   cloNIDine  0.2 mg Oral BID   clopidogrel  75 mg Oral Daily   cyanocobalamin  1,000 mcg Oral Daily   enoxaparin (LOVENOX) injection  40 mg Subcutaneous Q24H   metoprolol succinate  50 mg Oral Daily   pantoprazole  40 mg Oral QHS   PARoxetine  20 mg Oral Daily   polyethylene glycol  17 g Oral Daily   pregabalin  300 mg Oral QHS   rosuvastatin  20 mg Oral QHS   sodium chloride flush  3 mL Intravenous Q12H   sodium chloride flush  3 mL  Intravenous Q12H   Continuous Infusions:  sodium chloride       LOS: 1 day   Burnadette Pop, MD [facility] P12/29/2024, 2:32 PM

## 2023-11-08 ENCOUNTER — Other Ambulatory Visit (HOSPITAL_COMMUNITY): Payer: Self-pay

## 2023-11-08 DIAGNOSIS — I161 Hypertensive emergency: Secondary | ICD-10-CM | POA: Diagnosis not present

## 2023-11-08 LAB — BASIC METABOLIC PANEL
Anion gap: 7 (ref 5–15)
BUN: 35 mg/dL — ABNORMAL HIGH (ref 8–23)
CO2: 27 mmol/L (ref 22–32)
Calcium: 9 mg/dL (ref 8.9–10.3)
Chloride: 109 mmol/L (ref 98–111)
Creatinine, Ser: 1.22 mg/dL — ABNORMAL HIGH (ref 0.44–1.00)
GFR, Estimated: 48 mL/min — ABNORMAL LOW (ref >60)
Glucose, Bld: 109 mg/dL — ABNORMAL HIGH (ref 70–99)
Potassium: 4.2 mmol/L (ref 3.5–5.1)
Sodium: 143 mmol/L (ref 135–145)

## 2023-11-08 LAB — T3, FREE: T3, Free: 3 pg/mL (ref 2.0–4.4)

## 2023-11-08 MED ORDER — CLONIDINE HCL 0.2 MG PO TABS
0.2000 mg | ORAL_TABLET | Freq: Two times a day (BID) | ORAL | 0 refills | Status: DC
  Filled 2023-11-08: qty 60, 30d supply, fill #0

## 2023-11-08 MED ORDER — METOPROLOL SUCCINATE ER 50 MG PO TB24
50.0000 mg | ORAL_TABLET | Freq: Every day | ORAL | 0 refills | Status: DC
  Filled 2023-11-08: qty 30, 30d supply, fill #0

## 2023-11-08 MED ORDER — PREGABALIN 200 MG PO CAPS: 400.0000 mg | ORAL_CAPSULE | Freq: Every day | ORAL | Status: AC

## 2023-11-08 MED ORDER — AMLODIPINE BESYLATE 10 MG PO TABS
10.0000 mg | ORAL_TABLET | Freq: Every day | ORAL | 0 refills | Status: DC
  Filled 2023-11-08: qty 30, 30d supply, fill #0

## 2023-11-08 NOTE — Plan of Care (Signed)
  Problem: Education: Goal: Knowledge of condition and prescribed therapy will improve Outcome: Progressing   

## 2023-11-08 NOTE — Discharge Summary (Signed)
Physician Discharge Summary  Cheryl Levy WUJ:[id number] DOB: 1951-06-20 DOA: 11/06/2023  PCP: Cheryl Medin, PA-C  Admit date: 11/06/2023 Discharge date: 11/08/2023  Admitted From: Home Disposition:  Home  Discharge Condition:Stable CODE STATUS:FULL Diet recommendation: Heart Healthy   Brief/Interim Summary: Patient is a 72 year old female with history of hypertension, breast cancer, depression, fibromyalgia, CKD who presented with complaint of headache, 2 episodes of fall with syncopal. On presentation she was severely hypertensive. CT head was negative for any acute findings. Patient was admitted for further evaluation of syncopal episode. Orthostatic vitals checked here is negative.  Currently remains hemodynamically stable.  Headache has resolved.  Medically stable for discharge home today.  Following problems were addressed during the hospitalization:  Hypertensive urgency: Presented with severe headache, severe hypertension.  Blood pressure much better now.  Continue Toprol, amlodipine, clonidine.  Increased  the dose of amlodipine to 10 mg daily   Syncopal episode/fall: Lives with her husband.  Ambulates fine at home.  Report of falling at home.  No report of loss of consciousness or head injury.  CT head did not show any acute findings.  Orthostatic vital check this morning is negative.  PT recommend outpatient follow-up. Polypharmacy could be a possibility.  On Cymbalta, Paxil, Lyrica 600 mg.  Lyrica decreased to 400 mg Echo showed normal EF , no valvular abnormalities.  EKG did not show any heart blocks or ischemic findings   Headache: Resolved.  Likely due to severe hypertension   GERD: Continue PPI   History of nodular goiter/ hyperthyroidism: TSH is low,normal  free T4, pending free T3.  She needs to follow-up with endocrinology as an outpatient and repeat TFT in 4 to 6 weeks.  Referral provided   History of depression: On Paxil, Cymbalta at home.   Severe  hypokalemia/hypomagnesemia: Supplemented and corrected   discharge Diagnoses:  Principal Problem:   Hypertensive emergency Active Problems:   History of ischemic stroke   HTN (hypertension)   Hypersomnia with sleep apnea   Syncope and collapse   Bad headache   Gastroesophageal reflux disease   Goiter, nontoxic, multinodular   Hyperthyroidism   Major depressive disorder, recurrent, in full remission (HCC)   Vitamin B 12 deficiency    Discharge Instructions  Discharge Instructions     Ambulatory referral to Endocrinology   Complete by: As directed    Ambulatory referral to Physical Therapy   Complete by: As directed    Iontophoresis - 4 mg/ml of dexamethasone: No   T.E.N.S. Unit Evaluation and Dispense as Indicated: No   Diet - low sodium heart healthy   Complete by: As directed    Discharge instructions   Complete by: As directed    1)Please take prescribed medications as instructed 2)Follow up with your PCP in a week 3)Monitor your blood pressure at home 4)Follow up with endocrinology as an outpatient and do thyroid function test in 4 to 6 weeks.  We have also provided a referral.  You might be called for appointment.  Name and number the provider has been attached.  Call for appointment if you do not receive a call      Allergies as of 11/08/2023       Reactions   Ace Inhibitors Other (See Comments)   Chest pain   Oxycodone         Medication List     TAKE these medications    acetaminophen 325 MG tablet Commonly known as: Tylenol Take 2 tablets (650 mg total) by mouth  every 6 (six) hours as needed.   amLODipine 10 MG tablet Commonly known as: NORVASC Take 1 tablet (10 mg total) by mouth daily. Start taking on: November 09, 2023 What changed:  medication strength how much to take   cloNIDine 0.2 MG tablet Commonly known as: CATAPRES Take 1 tablet (0.2 mg total) by mouth 2 (two) times daily.   clopidogrel 75 MG tablet Commonly known as:  PLAVIX Take 75 mg by mouth daily.   cyanocobalamin 1000 MCG tablet Commonly known as: VITAMIN B12 Take 1 tablet (1,000 mcg total) by mouth daily.   DULoxetine 60 MG capsule Commonly known as: CYMBALTA Take 120 mg by mouth daily.   ibuprofen 600 MG tablet Commonly known as: ADVIL Take 1 tablet (600 mg total) by mouth every 6 (six) hours as needed. Take with food   metoprolol succinate 50 MG 24 hr tablet Commonly known as: TOPROL-XL Take 1 tablet (50 mg total) by mouth daily.   pantoprazole 40 MG tablet Commonly known as: PROTONIX Take 40 mg by mouth daily.   PARoxetine 20 MG tablet Commonly known as: PAXIL Take 1 tablet (20 mg total) by mouth daily.   pregabalin 200 MG capsule Commonly known as: LYRICA Take 2 capsules (400 mg total) by mouth at bedtime. What changed: how much to take   rosuvastatin 20 MG tablet Commonly known as: CRESTOR Take 1 tablet (20 mg total) by mouth daily.   Sunosi 150 MG Tabs Generic drug: Solriamfetol HCl Take 1 tablet by mouth daily.        Follow-up Information     Tina, [place], [place]. Schedule an appointment as soon as possible for a visit in 1 week(s).   Specialty: Family Medicine Contact information: [address] [place] [address] [phone]         [facility] Follow up.   Specialty: Rehabilitation Contact information: [address]  [address] [place] [address] [phone]        [place], [place], [place]. Schedule an appointment as soon as possible for a visit in 4 week(s).   Specialty: Endocrinology Contact information: [address] [place] [address] [place] [address] [phone]                Allergies  Allergen Reactions   Ace Inhibitors Other (See Comments)    Chest pain    Oxycodone     Consultations: Cardiology   Procedures/Studies: ECHOCARDIOGRAM COMPLETE Result Date: 11/07/2023     ECHOCARDIOGRAM REPORT   Patient Name:   Cheryl Levy Date of Exam: 11/07/2023 Medical Rec #:  [id number]    Height:       66.0 in Accession #:    [id number]   Weight:       153.2 lb Date of Birth:  1950-11-25    BSA:          1.786 m Patient Age:    72 years     BP:           150/91 mmHg Patient Gender: F            HR:           58 bpm. Exam Location:  Inpatient Procedure: 2D Echo, Color Doppler and Cardiac Doppler Indications:    syncope  History:        Patient has prior history of Echocardiogram examinations, most  recent 12/30/2022. Chronic kidney disease. History of breast                 cancer.; Risk Factors:Hypertension and Dyslipidemia.  Sonographer:    Delcie Roch RDCS Referring Phys: [id number] Shelbie Proctor PRATT  Sonographer Comments: Image acquisition challenging due to breast implants. IMPRESSIONS  1. Left ventricular ejection fraction, by estimation, is 60 to 65%. The left ventricle has normal function. The left ventricle has no regional wall motion abnormalities. Left ventricular diastolic parameters are indeterminate.  2. Right ventricular systolic function is normal. The right ventricular size is normal.  3. The mitral valve is normal in structure. No evidence of mitral valve regurgitation. No evidence of mitral stenosis.  4. The aortic valve is normal in structure. Aortic valve regurgitation is moderate. No aortic stenosis is present.  5. The inferior vena cava is normal in size with greater than 50% respiratory variability, suggesting right atrial pressure of 3 mmHg. FINDINGS  Left Ventricle: Left ventricular ejection fraction, by estimation, is 60 to 65%. The left ventricle has normal function. The left ventricle has no regional wall motion abnormalities. The left ventricular internal cavity size was normal in size. There is  no left ventricular hypertrophy. Left ventricular diastolic parameters are indeterminate. Right Ventricle: The right ventricular size is normal. No increase in  right ventricular wall thickness. Right ventricular systolic function is normal. Left Atrium: Left atrial size was normal in size. Right Atrium: Right atrial size was normal in size. Pericardium: There is no evidence of pericardial effusion. Mitral Valve: The mitral valve is normal in structure. No evidence of mitral valve regurgitation. No evidence of mitral valve stenosis. Tricuspid Valve: The tricuspid valve is normal in structure. Tricuspid valve regurgitation is not demonstrated. No evidence of tricuspid stenosis. Aortic Valve: The aortic valve is normal in structure. Aortic valve regurgitation is moderate. Aortic regurgitation PHT measures 628 msec. No aortic stenosis is present. Pulmonic Valve: The pulmonic valve was normal in structure. Pulmonic valve regurgitation is not visualized. No evidence of pulmonic stenosis. Aorta: The aortic root is normal in size and structure. Venous: The inferior vena cava is normal in size with greater than 50% respiratory variability, suggesting right atrial pressure of 3 mmHg. IAS/Shunts: No atrial level shunt detected by color flow Doppler.  LEFT VENTRICLE PLAX 2D LVOT diam:     2.10 cm   Diastology LV SV:         75        LV e' medial:    4.79 cm/s LV SV Index:   42        LV E/e' medial:  12.6 LVOT Area:     3.46 cm  LV e' lateral:   4.30 cm/s                          LV E/e' lateral: 14.0  RIGHT VENTRICLE             IVC RV Basal diam:  2.20 cm     IVC diam: 1.60 cm RV S prime:     14.30 cm/s TAPSE (M-mode): 2.0 cm LEFT ATRIUM             Index        RIGHT ATRIUM           Index LA Vol (A2C):   47.1 ml 26.38 ml/m  RA Area:     12.80 cm LA Vol (A4C):   46.6 ml 26.10  ml/m  RA Volume:   31.00 ml  17.36 ml/m LA Biplane Vol: 47.6 ml 26.66 ml/m  AORTIC VALVE LVOT Vmax:   89.30 cm/s LVOT Vmean:  56.800 cm/s LVOT VTI:    0.217 m AI PHT:      628 msec  AORTA Ao Root diam: 2.80 cm Ao Asc diam:  2.90 cm MITRAL VALVE MV Area (PHT): 2.62 cm    SHUNTS MV Decel Time: 290 msec     Systemic VTI:  0.22 m MV E velocity: 60.20 cm/s  Systemic Diam: 2.10 cm MV A velocity: 67.70 cm/s MV E/A ratio:  0.89 Kardie Tobb DO Electronically signed by Thomasene Ripple DO Signature Date/Time: 11/07/2023/3:52:01 PM    Final    DG Chest 2 View Result Date: 11/06/2023 CLINICAL DATA:  Syncope EXAM: CHEST - 2 VIEW COMPARISON:  08/13/2020 FINDINGS: The heart size and mediastinal contours are within normal limits. Aortic atherosclerosis. No focal airspace consolidation, pleural effusion, or pneumothorax. IMPRESSION: No active cardiopulmonary disease. Electronically Signed   By: Duanne Guess D.O.   On: 11/06/2023 15:35   CT Head Wo Contrast Result Date: 11/06/2023 CLINICAL DATA:  Head trauma. Recurrent syncope with trauma to head. Headache. Hypertension. EXAM: CT HEAD WITHOUT CONTRAST . TECHNIQUE: Contiguous axial images were obtained from the base of the skull through the vertex without intravenous contrast. RADIATION DOSE REDUCTION: This exam was performed according to the departmental dose-optimization program which includes automated exposure control, adjustment of the mA and/or kV according to patient size and/or use of iterative reconstruction technique. COMPARISON:  CT head without contrast and MR head without contrast 03/30/2023 FINDINGS: Brain: Advanced diffuse subcortical and white matter hypoattenuation compatible with multiple remote infarcts and chronic small vessel disease is stable. Lacunar infarcts are again noted in the basal ganglia bilaterally. No acute infarct or significant interval change is present. The ventricles are of normal size. No significant extraaxial fluid collection is present. White matter changes extend into the brainstem. The brainstem and cerebellum are otherwise within normal limits. Midline structures are within normal limits. Vascular: Atherosclerotic calcifications are again noted within the cavernous internal carotid arteries and at the dural margin of the right  vertebral artery. No hyperdense vessel is present. Skull: Calvarium is intact. No focal lytic or blastic lesions are present. No significant extracranial soft tissue lesion is present. Sinuses/Orbits: The paranasal sinuses and mastoid air cells are clear. The globes and orbits are within normal limits. IMPRESSION: 1. No acute intracranial abnormality or significant interval change. 2. Stable advanced diffuse subcortical and white matter hypoattenuation compatible with multiple remote infarcts and chronic small vessel disease. 3. Stable lacunar infarcts in the basal ganglia bilaterally. Electronically Signed   By: Marin Roberts M.D.   On: 11/06/2023 15:19   DG Shoulder Right Result Date: 10/20/2023 CLINICAL DATA:  Fall out of bed 2 nights ago with right shoulder pain, initial encounter EXAM: RIGHT SHOULDER - 2+ VIEW COMPARISON:  None Available. FINDINGS: Mild degenerative changes of the acromioclavicular and glenohumeral joints are seen. No acute fracture or dislocation is noted. No soft tissue abnormality is seen. IMPRESSION: No acute abnormality noted.  Mild degenerative changes are noted. Electronically Signed   By: Alcide Clever M.D.   On: 10/20/2023 20:52      Subjective: Patient seen and examined at bedside today.  Comfortable.  Freely ambulating inside the room.  Denies any headache.  Feels ready to go home.  Discharge Exam: Vitals:   11/08/23 0344 11/08/23 0838  BP: 130/82 (!) 171/69  Pulse: (!) 53 (!) 57  Resp: 17 18  Temp: 97.6 F (36.4 C) 97.9 F (36.6 C)  SpO2: 94% 96%   Vitals:   11/07/23 2110 11/08/23 0344 11/08/23 0350 11/08/23 0838  BP: (!) 174/79 130/82  (!) 171/69  Pulse: 60 (!) 53  (!) 57  Resp: 18 17  18   Temp: 98 F (36.7 C) 97.6 F (36.4 C)  97.9 F (36.6 C)  TempSrc: Oral Oral  Oral  SpO2: 99% 94%  96%  Weight:   69.7 kg   Height:        General: Pt is alert, awake, not in acute distress Cardiovascular: RRR, S1/S2 +, no rubs, no gallops Respiratory:  CTA bilaterally, no wheezing, no rhonchi Abdominal: Soft, NT, ND, bowel sounds + Extremities: no edema, no cyanosis    The results of significant diagnostics from this hospitalization (including imaging, microbiology, ancillary and laboratory) are listed below for reference.     Microbiology: No results found for this or any previous visit (from the past 240 hours).   Labs: BNP (last 3 results) Recent Labs    11/06/23 1357  BNP 349.8*   Basic Metabolic Panel: Recent Labs  Lab 11/06/23 1357 11/07/23 0521 11/08/23 0603  NA 143 142 143  K 3.4* 3.1* 4.2  CL 109 107 109  CO2 28 26 27   GLUCOSE 97 100* 109*  BUN 28* 21 35*  CREATININE 0.84 0.86 1.22*  CALCIUM 9.5 9.1 9.0  MG 1.7  --   --    Liver Function Tests: Recent Labs  Lab 11/06/23 1357 11/07/23 0521  AST 21 21  ALT 18 14  ALKPHOS 77 65  BILITOT 0.5 0.8  PROT 6.8 6.0*  ALBUMIN 3.9 3.2*   No results for input(s): "LIPASE", "AMYLASE" in the last 168 hours. No results for input(s): "AMMONIA" in the last 168 hours. CBC: Recent Labs  Lab 11/06/23 1357 11/07/23 0521  WBC 5.7 6.2  NEUTROABS 3.5  --   HGB 14.2 13.4  HCT 42.3 39.5  MCV 84.6 83.7  PLT 178 168   Cardiac Enzymes: No results for input(s): "CKTOTAL", "CKMB", "CKMBINDEX", "TROPONINI" in the last 168 hours. BNP: Invalid input(s): "POCBNP" CBG: No results for input(s): "GLUCAP" in the last 168 hours. D-Dimer No results for input(s): "DDIMER" in the last 72 hours. Hgb A1c No results for input(s): "HGBA1C" in the last 72 hours. Lipid Profile No results for input(s): "CHOL", "HDL", "LDLCALC", "TRIG", "CHOLHDL", "LDLDIRECT" in the last 72 hours. Thyroid function studies Recent Labs    11/06/23 1357  TSH 0.168*   Anemia work up Recent Labs    11/06/23 2131  VITAMINB12 1,123*   Urinalysis    Component Value Date/Time   COLORURINE YELLOW 11/06/2023 1357   APPEARANCEUR HAZY (A) 11/06/2023 1357   LABSPEC >=1.030 11/06/2023 1357    PHURINE 5.5 11/06/2023 1357   GLUCOSEU NEGATIVE 11/06/2023 1357   HGBUR TRACE (A) 11/06/2023 1357   BILIRUBINUR NEGATIVE 11/06/2023 1357   KETONESUR NEGATIVE 11/06/2023 1357   PROTEINUR 30 (A) 11/06/2023 1357   NITRITE NEGATIVE 11/06/2023 1357   LEUKOCYTESUR TRACE (A) 11/06/2023 1357   Sepsis Labs Recent Labs  Lab 11/06/23 1357 11/07/23 0521  WBC 5.7 6.2   Microbiology No results found for this or any previous visit (from the past 240 hours).  Please note: You were cared for by a hospitalist during your hospital stay. Once you are discharged, your primary care physician will handle any further medical issues. Please note that NO  REFILLS for any discharge medications will be authorized once you are discharged, as it is imperative that you return to your primary care physician (or establish a relationship with a primary care physician if you do not have one) for your post hospital discharge needs so that they can reassess your need for medications and monitor your lab values.    Time coordinating discharge: 40 minutes  SIGNED:   Burnadette Pop, MD  [facility] 11/08/2023, 10:58 AM Pager [phone]  If 7PM-7AM, please contact night-coverage [email or web address] Password [email or web address]

## 2023-11-08 NOTE — TOC Initial Note (Signed)
Transition of Care (TOC) - Initial/Assessment Note   PT recommending OP PT, no DME. Patient in agreement. Offered choice. [place]. Order entered and asked MD to sign.   Confirmed face sheet information  Patient Details  Name: Cheryl Levy MRN: [id number] Date of Birth: Apr 25, 1951  Transition of Care [facility]) CM/SW Contact:    Kingsley Plan, RN Phone Number: 11/08/2023, 10:59 AM  Clinical Narrative:                   Expected Discharge Plan: Home/Self Care Barriers to Discharge: No Barriers Identified   Patient Goals and CMS Choice Patient states their goals for this hospitalization and ongoing recovery are:: to return to home          Expected Discharge Plan and Services   Discharge Planning Services: CM Consult   Living arrangements for the past 2 months: Single Family Home Expected Discharge Date: 11/08/23               DME Arranged: N/A DME Agency: NA       HH Arranged: NA HH Agency: NA        Prior Living Arrangements/Services Living arrangements for the past 2 months: Single Family Home Lives with:: Spouse Patient language and need for interpreter reviewed:: Yes Do you feel safe going back to the place where you live?: Yes      Need for Family Participation in Patient Care: Yes (Comment) Care giver support system in place?: Yes (comment)   Criminal Activity/Legal Involvement Pertinent to Current Situation/Hospitalization: No - Comment as needed  Activities of Daily Living   ADL Screening (condition at time of admission) Independently performs ADLs?: Yes (appropriate for developmental age) Is the patient deaf or have difficulty hearing?: No Does the patient have difficulty seeing, even when wearing glasses/contacts?: No Does the patient have difficulty concentrating, remembering, or making decisions?: No  Permission Sought/Granted   Permission granted to share information with : No              Emotional  Assessment Appearance:: Appears stated age Attitude/Demeanor/Rapport: Engaged Affect (typically observed): Accepting Orientation: : Oriented to Self, Oriented to Place, Oriented to  Time, Oriented to Situation Alcohol / Substance Use: Not Applicable Psych Involvement: No (comment)  Admission diagnosis:  Syncope and collapse [R55] Elevated blood pressure reading [R03.0] Bad headache [R51.9] Hypertensive emergency [I16.1] Patient Active Problem List   Diagnosis Date Noted   Hypertensive emergency 11/06/2023   Syncope and collapse 11/06/2023   Bad headache 11/06/2023   Acute renal failure superimposed on stage 3a chronic kidney disease (HCC) 03/30/2023   Mild persistent asthma 03/30/2023   Depression with anxiety 03/30/2023   Fibromyalgia 03/30/2023   History of ischemic stroke 12/29/2022   Creatinine elevation 12/29/2022   HTN (hypertension) 12/29/2022   Difficulty walking 12/29/2022   History of breast cancer 12/29/2022   Major depressive disorder, recurrent, in full remission (HCC) 04/23/2022   Vitamin B 12 deficiency 08/01/2020   Goiter, nontoxic, multinodular 02/06/2020   Hyperthyroidism 01/25/2020   Gastroesophageal reflux disease 09/13/2019   Osteopenia of multiple sites 08/18/2017   Hypersomnia with sleep apnea 07/29/2017   Common migraine 12/18/2015   Hyperlipidemia 12/18/2015   Primary narcolepsy without cataplexy 12/18/2015   Malignant neoplasm of upper-outer quadrant of left female breast (HCC) 01/11/2015   PCP:  Burnis Medin, PA-C Pharmacy:   [facility] [phone] [place] Spice, [place] - [address] AT [facility] [address] [place] [address] Phone: [phone] Fax: [phone]  [facility] #[id number] - [place], [place] - [address] AT [facility] OF [address] [place] [address] Phone: [phone] Fax: [phone]  [facility] #[id number] - [place], [place] - [address] AT [address] OF  [address] [place] [address] Phone: [phone] Fax: [phone]  [facility] [place] [facility] [address] [place] [address] Phone: [phone] Fax: [phone]  [facility] [address] [place] [address] Phone: [phone] Fax: [phone]     Social Drivers of Health (SDOH) Social History: SDOH Screenings   Food Insecurity: No Food Insecurity (11/06/2023)  Housing: Low Risk  (11/06/2023)  Transportation Needs: No Transportation Needs (11/06/2023)  Utilities: Not At Risk (11/06/2023)  Tobacco Use: Low Risk  (11/06/2023)   SDOH Interventions:     Readmission Risk Interventions     No data to display

## 2023-11-08 NOTE — Progress Notes (Signed)
AVS given and reviewed with pt. Medications discussed. Transport at bedside and aware to take pt to [facility] pharmacy for medications. All questions answered to satisfaction. Pt verbalized understanding of information given. Pt escorted off the unit with all belongings via wheelchair by transport to discharge lounge.

## 2023-11-15 ENCOUNTER — Other Ambulatory Visit (HOSPITAL_BASED_OUTPATIENT_CLINIC_OR_DEPARTMENT_OTHER): Payer: Self-pay

## 2023-11-17 ENCOUNTER — Other Ambulatory Visit (HOSPITAL_BASED_OUTPATIENT_CLINIC_OR_DEPARTMENT_OTHER): Payer: Self-pay

## 2023-11-17 MED ORDER — PANTOPRAZOLE SODIUM 40 MG PO TBEC
40.0000 mg | DELAYED_RELEASE_TABLET | Freq: Every day | ORAL | 1 refills | Status: DC
  Filled 2023-11-17: qty 90, 90d supply, fill #0
  Filled 2024-02-16: qty 90, 90d supply, fill #1

## 2023-11-17 MED ORDER — TOBRAMYCIN 0.3 % OP SOLN
2.0000 [drp] | Freq: Four times a day (QID) | OPHTHALMIC | 0 refills | Status: AC
  Filled 2023-11-17: qty 5, 13d supply, fill #0

## 2023-11-17 MED ORDER — DICLOFENAC SODIUM 1 % EX GEL
Freq: Three times a day (TID) | CUTANEOUS | 1 refills | Status: AC
  Filled 2023-11-17: qty 300, 90d supply, fill #0

## 2023-12-02 ENCOUNTER — Other Ambulatory Visit (HOSPITAL_BASED_OUTPATIENT_CLINIC_OR_DEPARTMENT_OTHER): Payer: Self-pay

## 2023-12-02 MED ORDER — PAROXETINE HCL 20 MG PO TABS
20.0000 mg | ORAL_TABLET | Freq: Every day | ORAL | 0 refills | Status: AC
  Filled 2023-12-02: qty 90, 90d supply, fill #0

## 2023-12-02 MED ORDER — ROSUVASTATIN CALCIUM 10 MG PO TABS
10.0000 mg | ORAL_TABLET | Freq: Every day | ORAL | 0 refills | Status: DC
  Filled 2023-12-02: qty 90, 90d supply, fill #0

## 2023-12-02 MED ORDER — DULOXETINE HCL 60 MG PO CPEP
60.0000 mg | ORAL_CAPSULE | Freq: Two times a day (BID) | ORAL | 0 refills | Status: DC
  Filled 2023-12-02: qty 180, 90d supply, fill #0

## 2023-12-03 ENCOUNTER — Other Ambulatory Visit: Payer: Self-pay

## 2023-12-03 ENCOUNTER — Other Ambulatory Visit (HOSPITAL_COMMUNITY): Payer: Self-pay

## 2023-12-03 ENCOUNTER — Other Ambulatory Visit (HOSPITAL_BASED_OUTPATIENT_CLINIC_OR_DEPARTMENT_OTHER): Payer: Self-pay

## 2023-12-06 ENCOUNTER — Other Ambulatory Visit (HOSPITAL_BASED_OUTPATIENT_CLINIC_OR_DEPARTMENT_OTHER): Payer: Self-pay

## 2023-12-07 ENCOUNTER — Other Ambulatory Visit (HOSPITAL_BASED_OUTPATIENT_CLINIC_OR_DEPARTMENT_OTHER): Payer: Self-pay

## 2023-12-07 MED ORDER — AMLODIPINE BESYLATE 10 MG PO TABS
10.0000 mg | ORAL_TABLET | Freq: Every day | ORAL | 0 refills | Status: DC
  Filled 2023-12-07: qty 30, 30d supply, fill #0

## 2023-12-07 MED ORDER — METOPROLOL SUCCINATE ER 50 MG PO TB24
50.0000 mg | ORAL_TABLET | Freq: Every day | ORAL | 0 refills | Status: DC
  Filled 2023-12-07: qty 60, 60d supply, fill #0

## 2023-12-09 ENCOUNTER — Other Ambulatory Visit (HOSPITAL_BASED_OUTPATIENT_CLINIC_OR_DEPARTMENT_OTHER): Payer: Self-pay

## 2023-12-17 ENCOUNTER — Other Ambulatory Visit (HOSPITAL_BASED_OUTPATIENT_CLINIC_OR_DEPARTMENT_OTHER): Payer: Self-pay

## 2023-12-17 MED ORDER — PREGABALIN 200 MG PO CAPS
200.0000 mg | ORAL_CAPSULE | Freq: Three times a day (TID) | ORAL | 0 refills | Status: AC
  Filled 2023-12-17: qty 270, 90d supply, fill #0

## 2023-12-23 ENCOUNTER — Other Ambulatory Visit (HOSPITAL_BASED_OUTPATIENT_CLINIC_OR_DEPARTMENT_OTHER): Payer: Self-pay

## 2024-01-07 ENCOUNTER — Other Ambulatory Visit (HOSPITAL_BASED_OUTPATIENT_CLINIC_OR_DEPARTMENT_OTHER): Payer: Self-pay

## 2024-01-07 ENCOUNTER — Other Ambulatory Visit: Payer: Self-pay

## 2024-01-07 ENCOUNTER — Encounter (HOSPITAL_BASED_OUTPATIENT_CLINIC_OR_DEPARTMENT_OTHER): Payer: Self-pay

## 2024-01-07 MED ORDER — PROTRIPTYLINE HCL 10 MG PO TABS
10.0000 mg | ORAL_TABLET | Freq: Every evening | ORAL | 0 refills | Status: DC
  Filled 2024-01-07: qty 90, 90d supply, fill #0

## 2024-01-07 MED ORDER — PANTOPRAZOLE SODIUM 40 MG PO TBEC
40.0000 mg | DELAYED_RELEASE_TABLET | Freq: Every day | ORAL | 1 refills | Status: AC
  Filled 2024-01-07: qty 90, 90d supply, fill #0

## 2024-01-07 MED ORDER — DULOXETINE HCL 60 MG PO CPEP
60.0000 mg | ORAL_CAPSULE | Freq: Two times a day (BID) | ORAL | 0 refills | Status: AC
  Filled 2024-01-07: qty 180, 90d supply, fill #0

## 2024-01-10 ENCOUNTER — Other Ambulatory Visit (HOSPITAL_BASED_OUTPATIENT_CLINIC_OR_DEPARTMENT_OTHER): Payer: Self-pay

## 2024-01-10 ENCOUNTER — Other Ambulatory Visit: Payer: Self-pay

## 2024-01-10 MED ORDER — DULOXETINE HCL 60 MG PO CPEP: ORAL_CAPSULE | ORAL | 1 refills | Status: AC

## 2024-01-10 MED ORDER — CLOPIDOGREL BISULFATE 75 MG PO TABS
75.0000 mg | ORAL_TABLET | Freq: Every day | ORAL | 2 refills | Status: AC
  Filled 2024-01-10: qty 90, 90d supply, fill #0

## 2024-01-10 MED ORDER — CLOPIDOGREL BISULFATE 75 MG PO TABS
75.0000 mg | ORAL_TABLET | Freq: Every day | ORAL | 2 refills | Status: AC
Start: 2023-02-08 — End: ?
  Filled 2024-01-10: qty 90, 90d supply, fill #0

## 2024-01-10 MED ORDER — PAROXETINE HCL 20 MG PO TABS
20.0000 mg | ORAL_TABLET | Freq: Every morning | ORAL | 1 refills | Status: AC
  Filled 2024-01-10 (×2): qty 90, 90d supply, fill #0

## 2024-01-10 MED ORDER — CLONIDINE HCL 0.2 MG PO TABS
0.2000 mg | ORAL_TABLET | Freq: Two times a day (BID) | ORAL | 1 refills | Status: AC
  Filled 2024-01-10: qty 60, 30d supply, fill #0

## 2024-01-31 ENCOUNTER — Other Ambulatory Visit (HOSPITAL_BASED_OUTPATIENT_CLINIC_OR_DEPARTMENT_OTHER): Payer: Self-pay

## 2024-01-31 ENCOUNTER — Encounter (HOSPITAL_BASED_OUTPATIENT_CLINIC_OR_DEPARTMENT_OTHER): Payer: Self-pay

## 2024-02-01 ENCOUNTER — Other Ambulatory Visit (HOSPITAL_BASED_OUTPATIENT_CLINIC_OR_DEPARTMENT_OTHER): Payer: Self-pay

## 2024-02-03 ENCOUNTER — Other Ambulatory Visit (HOSPITAL_BASED_OUTPATIENT_CLINIC_OR_DEPARTMENT_OTHER): Payer: Self-pay

## 2024-02-03 MED ORDER — METOPROLOL SUCCINATE ER 50 MG PO TB24
50.0000 mg | ORAL_TABLET | Freq: Every day | ORAL | 0 refills | Status: AC
  Filled 2024-02-03: qty 10, 10d supply, fill #0

## 2024-02-10 ENCOUNTER — Other Ambulatory Visit: Payer: Self-pay

## 2024-02-10 ENCOUNTER — Other Ambulatory Visit (HOSPITAL_BASED_OUTPATIENT_CLINIC_OR_DEPARTMENT_OTHER): Payer: Self-pay

## 2024-02-10 MED ORDER — METOPROLOL SUCCINATE ER 50 MG PO TB24
50.0000 mg | ORAL_TABLET | Freq: Every day | ORAL | 1 refills | Status: DC
  Filled 2024-02-10 – 2024-02-11 (×2): qty 90, 90d supply, fill #0
  Filled 2024-05-15: qty 90, 90d supply, fill #1

## 2024-02-10 MED ORDER — CLONIDINE HCL 0.2 MG PO TABS
0.2000 mg | ORAL_TABLET | Freq: Two times a day (BID) | ORAL | 1 refills | Status: AC
Start: 2024-02-10 — End: ?
  Filled 2024-02-10: qty 180, 90d supply, fill #0
  Filled 2024-05-15: qty 180, 90d supply, fill #1

## 2024-02-10 MED ORDER — ROSUVASTATIN CALCIUM 10 MG PO TABS
10.0000 mg | ORAL_TABLET | Freq: Every day | ORAL | 1 refills | Status: DC
  Filled 2024-02-10: qty 90, 90d supply, fill #0
  Filled 2024-06-23: qty 90, 90d supply, fill #1

## 2024-02-10 MED ORDER — AMLODIPINE BESYLATE 10 MG PO TABS
10.0000 mg | ORAL_TABLET | Freq: Every day | ORAL | 1 refills | Status: DC
  Filled 2024-02-10: qty 90, 90d supply, fill #0
  Filled 2024-05-15: qty 90, 90d supply, fill #1

## 2024-02-11 ENCOUNTER — Other Ambulatory Visit (HOSPITAL_BASED_OUTPATIENT_CLINIC_OR_DEPARTMENT_OTHER): Payer: Self-pay

## 2024-02-11 ENCOUNTER — Other Ambulatory Visit: Payer: Self-pay

## 2024-02-17 ENCOUNTER — Other Ambulatory Visit (HOSPITAL_BASED_OUTPATIENT_CLINIC_OR_DEPARTMENT_OTHER): Payer: Self-pay

## 2024-02-17 MED ORDER — SUNOSI 150 MG PO TABS
150.0000 mg | ORAL_TABLET | Freq: Every morning | ORAL | 1 refills | Status: DC
  Filled 2024-02-17: qty 90, 90d supply, fill #0
  Filled 2024-06-23: qty 90, 90d supply, fill #1

## 2024-02-18 ENCOUNTER — Other Ambulatory Visit: Payer: Self-pay

## 2024-02-21 ENCOUNTER — Other Ambulatory Visit: Payer: Self-pay

## 2024-03-03 NOTE — Progress Notes (Deleted)
 NEUROLOGY FOLLOW UP OFFICE NOTE  Cheryl Levy [id number]  Subjective:  Cheryl Levy is a 73 y.o. year old right-handed female with a medical history of stroke, HTN, HLD, B12 deficiency, left breast cancer, asthma, GERD, hyperthyroidism, anxiety, depression, narcolepsy, fibromyalgia/chronic pain syndrome, migraine who we last saw on 09/17/23 for ***.  To briefly review: 03/17/23: On [birth year] (patient's birthday) patient was walking all over the place (side to side). She felt fine, without imbalance, and even drove home. The next day patient was talking to her daughter and per daughter, she was not making sense when talking. She went to the hospital on 12/27/22 because of this.    Per documentation, patient presented to ED on 12/29/22 for dizziness, double vision, and confusion. CT and MRI were concerning for stroke, though not acute. MRA showed moderate, primarily posterior circulation, stenosis. 30 day heart monitoring was recommended. Holter showed no evidence of afib and was essentially unremarkable.   Patient was started on DAPT with aspirin  and plavix . She was told to stop aspirin  by other neurologist since mid 01/2023 ([facility]). She is currently on Plavix  75 mg daily. She is also on Crestor  20 mg daily.   Currently patient denies symptoms. She does endorse some more long standing cognitive changes. She states she cannot count or do math in her head. She has difficulty spelling. She has some memory problems which has been going on for > 1 year. She states she had neuropsych testing in the past year at [facility] that she was told was nothing concerning. I do not see these results.   Patient does endorse a very stressful life. She takes care of husband who has had amputations. Her son tried to kill self last year, and now found to have cancer and CHF at 71 years old   Patient previously took B12 shots, but is not currently on supplementation. She is also not on vit D supplementation.   Patient states  her sleep is currently okay. She does not think she snores. She does not feel well rested in the morning. She is tired throughout the day. She does take naps during day. She last had a sleep study over 10 years ago. Those results are not available to me.   Patient has previously been seen in neurology by [facility] (last on 02/08/23). Most recent assessment and plan: IMPRESSION:  Small left and right lacunar CVAs 12/29/22 3 mm aneurysm left ICA. Fibromyalgia Narcolepsy Migraines rare Unable to work due to pain/narcolepsy, poor concentration Low B12  PLAN:  Not taking Ritalin  due to strokes Continue Plavix , etc, not aspirin  Sunosi  150 mg/d    Smoking: Never EtOH use: Never Drug use: Never Restrictive diet: No  09/17/23: Labs were significant for borderline B12. I recommended B12 1000 mcg daily. She is taking this.   Patient is feeling well today. She is exhausted and stressed due to taking care of her husband.   Patient presented to ED on 03/30/23 for balance problems and repeat imaging showed no acute process.   She continue to take Plavix  75 mg daily and Crestor  20 mg daily.   She did not see sleep medicine. She does not think she was ever called. For now, she is being followed at [facility] neurology by Dr. Merced Stair for her narcolepsy.   She has no new complaints today.  Most recent Assessment and Plan (09/17/23): This is Cheryl Levy, a 73 y.o. female with a history of stroke (left centrum semiovale, left splenium corpus collosum, pons) and extensive  chronic microvascular ischemia. Given her very high BP today (SBP > 200), the likely etiology is poorly controlled risk factors such as HTN. Her LDL was recently 130s despite being on Crestor  40 mg. I am concerned about medication non-adherence. She also has a lot of stressors given that she is taking care of her husband. Labs have also been significant for B12 and vit D deficiencies. Despite her very high BP today, she is doing well with no  clear neurologic deficits.   Plan: -Continue B12 supplementation 1000 mcg daily -Continue Vitamin D supplementation 1000 internation units (IU) daily -Continue Plavix  75 mg daily -Continue Crestor  20 mg daily   -Sleep medicine referral for narcolepsy -Discussed repeating MRI and CTA, but given no new symptoms and very high BP today, this likely explains disease burden seen, so will hold off for now -Discuss with PCP about BP. We discussed going to nearest ED if she has any symptoms, including but not limited to HA or vision changes or focal neurologic deficits. -Discussed the importance of controlling risk factors and stress. -Discussed stroke warning signs  Since their last visit: Admitted from 11/06/23-11/08/23 with headache and 2 falls with syncope BP very elevated at presentation - hypertensive urgency (SBP in 200s at home)  MEDICATIONS:  Outpatient Encounter Medications as of 03/16/2024  Medication Sig   acetaminophen  (TYLENOL ) 325 MG tablet Take 2 tablets (650 mg total) by mouth every 6 (six) hours as needed. (Patient not taking: Reported on 11/07/2023)   amLODipine  (NORVASC ) 10 MG tablet Take 1 tablet (10 mg total) by mouth daily.   cloNIDine  (CATAPRES ) 0.2 MG tablet Take 1 tablet (0.2 mg total) by mouth 2 (two) times daily for high blood pressure   cloNIDine  (CATAPRES ) 0.2 MG tablet Take 1 tablet (0.2 mg total) by mouth 2 (two) times daily for high blood pressure.   clopidogrel  (PLAVIX ) 75 MG tablet Take 75 mg by mouth daily.   clopidogrel  (PLAVIX ) 75 MG tablet Take 1 tablet (75 mg total) by mouth daily.   clopidogrel  (PLAVIX ) 75 MG tablet Take 1 tablet (75 mg total) by mouth daily.   cyanocobalamin  1000 MCG tablet Take 1 tablet (1,000 mcg total) by mouth daily.   diclofenac  Sodium (VOLTAREN ) 1 % GEL Apply 1 g topically 3 (three) times daily.   DULoxetine  (CYMBALTA ) 60 MG capsule Take 120 mg by mouth daily.   DULoxetine  (CYMBALTA ) 60 MG capsule Take 1 capsule (60 mg total) by  mouth 2 (two) times daily.   DULoxetine  (CYMBALTA ) 60 MG capsule Take 1 capsule (60 mg total) by mouth 2 (two) times a day.   ibuprofen  (ADVIL ) 600 MG tablet Take 1 tablet (600 mg total) by mouth every 6 (six) hours as needed. Take with food   metoprolol  succinate (TOPROL -XL) 50 MG 24 hr tablet Take 1 tablet (50 mg total) by mouth daily.   metoprolol  succinate (TOPROL -XL) 50 MG 24 hr tablet Take 1 tablet (50 mg total) by mouth daily.   pantoprazole  (PROTONIX ) 40 MG tablet Take 40 mg by mouth daily.   pantoprazole  (PROTONIX ) 40 MG tablet Take 1 tablet (40 mg total) by mouth daily.   pantoprazole  (PROTONIX ) 40 MG tablet Take 1 tablet (40 mg total) by mouth daily.   PARoxetine  (PAXIL ) 20 MG tablet Take 1 tablet (20 mg total) by mouth daily.   PARoxetine  (PAXIL ) 20 MG tablet Take 1 tablet (20 mg total) by mouth daily.   PARoxetine  (PAXIL ) 20 MG tablet Take 1 tablet (20 mg total) by mouth every  morning.   pregabalin  (LYRICA ) 200 MG capsule Take 2 capsules (400 mg total) by mouth at bedtime.   pregabalin  (LYRICA ) 200 MG capsule Take 1 capsule (200 mg total) by mouth 3 (three) times daily. schedule follow up appointment in order to receive any additional refills please   rosuvastatin  (CRESTOR ) 10 MG tablet Take 1 tablet (10 mg total) by mouth daily.   rosuvastatin  (CRESTOR ) 20 MG tablet Take 1 tablet (20 mg total) by mouth daily.   Solriamfetol  HCl (SUNOSI ) 150 MG TABS Take 1 tablet by mouth daily.   Solriamfetol  HCl (SUNOSI ) 150 MG TABS Take 1 tablet (150 mg total) by mouth every morning. Avoid taking within 9 hours of planned bedtime.   tobramycin  (TOBREX ) 0.3 % ophthalmic solution Administer 2 drops into the right eye every 4 (four) hours for 5 days.   No facility-administered encounter medications on file as of 03/16/2024.    PAST MEDICAL HISTORY: Past Medical History:  Diagnosis Date   Cancer [facility])    breast cancer   Hypertension    Stroke (cerebrum) (HCC)    Thyroid  disease     PAST  SURGICAL HISTORY: Past Surgical History:  Procedure Laterality Date   ABDOMINAL HYSTERECTOMY     BACK SURGERY     JOINT REPLACEMENT     MASTECTOMY Bilateral    THYROIDECTOMY      ALLERGIES: Allergies  Allergen Reactions   Ace Inhibitors Other (See Comments)    Chest pain    Oxycodone      FAMILY HISTORY: No family history on file.  SOCIAL HISTORY: Social History   Tobacco Use   Smoking status: Never   Smokeless tobacco: Never  Vaping Use   Vaping status: Never Used  Substance Use Topics   Alcohol use: No   Drug use: No   Social History   Social History Narrative   Are you right handed or left handed? Right   Are you currently employed ?    What is your current occupation? retired   Do you live at home alone? no   Who lives with you? Husband and family   What type of home do you live in: 1 story or 2 story? two    Caffeine none      Objective:  Vital Signs:  There were no vitals taken for this visit.  ***  Labs and Imaging review: New results: External lipid panel (02/10/24): tChol 144, LDL 75, TG 169  B12 (11/06/23): 1123 TSH (11/06/23): low at 0.168  CT head wo contrast (11/06/23): IMPRESSION: 1. No acute intracranial abnormality or significant interval change. 2. Stable advanced diffuse subcortical and white matter hypoattenuation compatible with multiple remote infarcts and chronic small vessel disease. 3. Stable lacunar infarcts in the basal ganglia bilaterally.  Previously reviewed results: 03/17/23: B12: 256 CRP wnl ESR wnl ANCA negative ANA negative   06/03/23 (external): CMP significant for Cr 1.23 Lipid panel: total cholesterol 191, LDL 131, TG 114         Lab Results  Component Value Date    HGBA1C 5.7 (H) 12/29/2022      Recent Labs           Lab Results  Component Value Date    VITAMINB12 164 (L) 12/30/2022      Recent Labs[] Expand by Default           Lab Results  Component Value Date    TSH 0.687 12/29/2022       02/11/23: EtOH < 10 Urine drug  screen: negative CBC unremarkable CMP unremarkable   12/30/22: Ammonia 38 (upper limit of normal 35) HIV nonreactive Lipid panel:   Component     Latest Ref Rng 12/29/2022  Cholesterol     0 - 200 mg/dL 960   Triglycerides     <150 mg/dL 454   HDL Cholesterol     >40 mg/dL 35 (L)   Total CHOL/HDL Ratio     RATIO 4.3   VLDL     0 - 40 mg/dL 23   LDL (calc)     0 - 99 mg/dL 93       External labs: CMP (10/22/22) unremarkable Vit D (03/19/22): 25   Imaging: MRI brain wo contrast (02/12/23): IMPRESSION: 1. No acute intracranial abnormality. 2. But a late subacute to early chronic white matter lacunar infarct in the posterior left centrum semiovale is new since February. Expected evolution of left splenium corpus callosum lacunar infarcts since that time. And underlying advanced chronic signal changes in the white matter (including elsewhere in the corpus callosum, bilateral deep gray matter, and pons. Underlying dilated perivascular spaces in the deep gray nuclei, such that this might be chronic hypertensive encephalopathy/small vessel disease. But other small vessel vasculopathies are not excluded.   CTA head and neck (02/11/23): IMPRESSION: 1. No acute intracranial process. Evolving infarct in the left splenium of the corpus callosum. 2. No intracranial large vessel occlusion. Moderate stenosis in the distal right P2. 3. No hemodynamically significant stenosis in the neck. 4. 2 mm posteromedially directed outpouching from the right supraclinoid ICA, and 3 mm laterally directed outpouching from the left supraclinoid ICA, concerning for aneurysms. 5. Linear projection extending from the distal basilar artery, at the level of the takeoff of the right PCA, is favored to represent a thrombosed left P1. 6. Fetal origin of the left PCA, with mild stenosis at what was once likely the P1-P2 junction. 7. Aortic atherosclerosis.   MRA head  (12/30/22): IMPRESSION: 1. Approximately 4 x 2 tubular outpouching rising from the basilar tip is favored to represent a thrombosed left P1 PCA. The left posterior communicating artery supplies the left posterior cerebral artery which is otherwise patent. 2. Moderate right distal P2 PCA stenosis. 3. 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   MRI brain w/wo contrast (12/29/22 and 12/30/22): IMPRESSION: The lesion in the left aspect of the splenium of the corpus callosum does not demonstrate significant enhancement and is not compatible with a metastasis.   Echo (12/30/22):  1. Left ventricular ejection fraction, by estimation, is 55 to 60%. The  left ventricle has normal function. The left ventricle has no regional  wall motion abnormalities. There is mild concentric left ventricular  hypertrophy. Left ventricular diastolic  parameters are consistent with Grade I diastolic dysfunction (impaired  relaxation).   2. Right ventricular systolic function is normal. The right ventricular  size is normal.   3. The mitral valve is normal in structure. Trivial mitral valve  regurgitation. No evidence of mitral stenosis.   4. The aortic valve was not well visualized. Aortic valve regurgitation  is mild. No aortic stenosis is present.   5. The inferior vena cava is normal in size with greater than 50%  respiratory variability, suggesting right atrial pressure of 3 mmHg.  Left [facility]: Left atrial size was normal in size.    Holter monitor (30 days) 02/18/23: Sinus rhythm average heartbeat 75 bpm   No atrial fibrillation   Brief 9  beat run of nonsustained ventricular tachycardia.  Asymptomatic.   Rare PVCs and PACs.   MRI/MRA brain and neck (03/30/23): IMPRESSION: 1. No acute intracranial abnormality. 2. 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 3. Unchanged 3 mm aneurysm or infundibulum of a thrombosed left  PCA P1 segment at the distal basilar artery. 4. Mild chronic small vessel ischemic disease.  Assessment/Plan:  This is Cheryl Levy, a 73 y.o. female with: ***   Plan: ***  Return to clinic in ***  Total time spent reviewing records, interview, history/exam, documentation, and coordination of care on day of encounter:  *** min  Rommie Coats, MD

## 2024-03-16 ENCOUNTER — Ambulatory Visit: Payer: Medicare HMO | Admitting: Neurology

## 2024-03-16 ENCOUNTER — Other Ambulatory Visit (HOSPITAL_BASED_OUTPATIENT_CLINIC_OR_DEPARTMENT_OTHER): Payer: Self-pay

## 2024-03-16 ENCOUNTER — Encounter: Payer: Self-pay | Admitting: Neurology

## 2024-03-16 MED ORDER — DULOXETINE HCL 60 MG PO CPEP
60.0000 mg | ORAL_CAPSULE | Freq: Two times a day (BID) | ORAL | 1 refills | Status: AC
  Filled 2024-03-16: qty 180, 90d supply, fill #0
  Filled 2024-06-23: qty 180, 90d supply, fill #1

## 2024-03-21 ENCOUNTER — Other Ambulatory Visit (HOSPITAL_BASED_OUTPATIENT_CLINIC_OR_DEPARTMENT_OTHER): Payer: Self-pay

## 2024-04-12 ENCOUNTER — Other Ambulatory Visit (HOSPITAL_BASED_OUTPATIENT_CLINIC_OR_DEPARTMENT_OTHER): Payer: Self-pay

## 2024-04-12 MED ORDER — PREGABALIN 200 MG PO CAPS
200.0000 mg | ORAL_CAPSULE | Freq: Three times a day (TID) | ORAL | 1 refills | Status: DC
  Filled 2024-04-12: qty 270, 90d supply, fill #0
  Filled 2024-09-12: qty 180, 60d supply, fill #1
  Filled 2024-09-13: qty 90, 30d supply, fill #1

## 2024-04-12 MED ORDER — CLOPIDOGREL BISULFATE 75 MG PO TABS
75.0000 mg | ORAL_TABLET | Freq: Every day | ORAL | 1 refills | Status: AC
  Filled 2024-04-12: qty 90, 90d supply, fill #0
  Filled 2024-08-07: qty 90, 90d supply, fill #1

## 2024-04-12 MED ORDER — PAROXETINE HCL 20 MG PO TABS
20.0000 mg | ORAL_TABLET | Freq: Every morning | ORAL | 1 refills | Status: AC
Start: 2024-04-12 — End: ?
  Filled 2024-04-12: qty 90, 90d supply, fill #0
  Filled 2024-07-19: qty 90, 90d supply, fill #1

## 2024-05-03 ENCOUNTER — Other Ambulatory Visit (HOSPITAL_COMMUNITY): Payer: Self-pay

## 2024-05-15 ENCOUNTER — Other Ambulatory Visit (HOSPITAL_BASED_OUTPATIENT_CLINIC_OR_DEPARTMENT_OTHER): Payer: Self-pay

## 2024-05-15 MED ORDER — PANTOPRAZOLE SODIUM 40 MG PO TBEC
40.0000 mg | DELAYED_RELEASE_TABLET | Freq: Every day | ORAL | 0 refills | Status: DC
  Filled 2024-05-15: qty 90, 90d supply, fill #0

## 2024-06-23 ENCOUNTER — Other Ambulatory Visit (HOSPITAL_BASED_OUTPATIENT_CLINIC_OR_DEPARTMENT_OTHER): Payer: Self-pay

## 2024-06-26 ENCOUNTER — Other Ambulatory Visit (HOSPITAL_BASED_OUTPATIENT_CLINIC_OR_DEPARTMENT_OTHER): Payer: Self-pay

## 2024-06-30 ENCOUNTER — Other Ambulatory Visit (HOSPITAL_BASED_OUTPATIENT_CLINIC_OR_DEPARTMENT_OTHER): Payer: Self-pay

## 2024-07-17 ENCOUNTER — Other Ambulatory Visit (HOSPITAL_BASED_OUTPATIENT_CLINIC_OR_DEPARTMENT_OTHER): Payer: Self-pay

## 2024-07-17 MED ORDER — METHYLPHENIDATE HCL 5 MG PO TABS
5.0000 mg | ORAL_TABLET | Freq: Two times a day (BID) | ORAL | 0 refills | Status: DC
  Filled 2024-07-17: qty 60, 30d supply, fill #0

## 2024-07-24 ENCOUNTER — Other Ambulatory Visit (HOSPITAL_BASED_OUTPATIENT_CLINIC_OR_DEPARTMENT_OTHER): Payer: Self-pay

## 2024-07-24 MED ORDER — METOPROLOL SUCCINATE ER 50 MG PO TB24
50.0000 mg | ORAL_TABLET | Freq: Every day | ORAL | 1 refills | Status: AC
  Filled 2024-07-24: qty 90, 90d supply, fill #0
  Filled 2024-11-27: qty 90, 90d supply, fill #1

## 2024-07-24 MED ORDER — PANTOPRAZOLE SODIUM 40 MG PO TBEC
40.0000 mg | DELAYED_RELEASE_TABLET | Freq: Every day | ORAL | 1 refills | Status: AC
  Filled 2024-07-24: qty 90, 90d supply, fill #0
  Filled 2024-11-02: qty 90, 90d supply, fill #1

## 2024-07-24 MED ORDER — AMLODIPINE BESYLATE 10 MG PO TABS
10.0000 mg | ORAL_TABLET | Freq: Every day | ORAL | 1 refills | Status: AC
  Filled 2024-07-24: qty 90, 90d supply, fill #0
  Filled 2024-11-27: qty 90, 90d supply, fill #1

## 2024-07-24 MED ORDER — ROSUVASTATIN CALCIUM 10 MG PO TABS
10.0000 mg | ORAL_TABLET | Freq: Every day | ORAL | 1 refills | Status: AC
  Filled 2024-07-24: qty 90, 90d supply, fill #0

## 2024-07-24 MED ORDER — CLONIDINE HCL 0.2 MG PO TABS
ORAL_TABLET | ORAL | 1 refills | Status: AC
  Filled 2024-07-24: qty 180, 90d supply, fill #0
  Filled 2024-11-10: qty 180, 90d supply, fill #1

## 2024-09-07 ENCOUNTER — Other Ambulatory Visit (HOSPITAL_BASED_OUTPATIENT_CLINIC_OR_DEPARTMENT_OTHER): Payer: Self-pay

## 2024-09-07 MED ORDER — METHYLPHENIDATE HCL 10 MG PO TABS
10.0000 mg | ORAL_TABLET | Freq: Two times a day (BID) | ORAL | 0 refills | Status: DC
  Filled 2024-09-07: qty 60, 30d supply, fill #0
  Filled 2024-09-07: qty 19, 10d supply, fill #0
  Filled 2024-09-07: qty 41, 20d supply, fill #0

## 2024-09-07 MED ORDER — DULOXETINE HCL 60 MG PO CPEP
60.0000 mg | ORAL_CAPSULE | Freq: Two times a day (BID) | ORAL | 1 refills | Status: AC
Start: 2024-09-07 — End: ?
  Filled 2024-09-07: qty 180, 90d supply, fill #0

## 2024-09-13 ENCOUNTER — Other Ambulatory Visit (HOSPITAL_BASED_OUTPATIENT_CLINIC_OR_DEPARTMENT_OTHER): Payer: Self-pay

## 2024-09-13 ENCOUNTER — Other Ambulatory Visit: Payer: Self-pay

## 2024-10-31 ENCOUNTER — Other Ambulatory Visit (HOSPITAL_BASED_OUTPATIENT_CLINIC_OR_DEPARTMENT_OTHER): Payer: Self-pay

## 2024-10-31 MED ORDER — ALBUTEROL SULFATE HFA 108 (90 BASE) MCG/ACT IN AERS
INHALATION_SPRAY | RESPIRATORY_TRACT | 0 refills | Status: AC
  Filled 2024-10-31: qty 6.7, 25d supply, fill #0

## 2024-11-03 ENCOUNTER — Other Ambulatory Visit: Payer: Self-pay

## 2024-11-03 ENCOUNTER — Other Ambulatory Visit (HOSPITAL_BASED_OUTPATIENT_CLINIC_OR_DEPARTMENT_OTHER): Payer: Self-pay

## 2024-11-03 MED ORDER — METHYLPHENIDATE HCL 10 MG PO TABS
10.0000 mg | ORAL_TABLET | Freq: Two times a day (BID) | ORAL | 0 refills | Status: AC
  Filled 2024-11-03: qty 60, 30d supply, fill #0

## 2024-11-03 MED ORDER — CLOPIDOGREL BISULFATE 75 MG PO TABS
75.0000 mg | ORAL_TABLET | Freq: Every day | ORAL | 1 refills | Status: AC
  Filled 2024-11-03: qty 90, 90d supply, fill #0

## 2024-11-03 MED ORDER — PREGABALIN 200 MG PO CAPS
200.0000 mg | ORAL_CAPSULE | Freq: Three times a day (TID) | ORAL | 1 refills | Status: AC
  Filled 2024-11-03: qty 270, 90d supply, fill #0

## 2024-11-27 ENCOUNTER — Other Ambulatory Visit: Payer: Self-pay

## 2024-11-27 ENCOUNTER — Other Ambulatory Visit (HOSPITAL_BASED_OUTPATIENT_CLINIC_OR_DEPARTMENT_OTHER): Payer: Self-pay
